# Patient Record
Sex: Male | Born: 2000
Health system: Southern US, Community
[De-identification: ages and names within clinical notes are randomized; demographics above are authoritative.]

## PROBLEM LIST (undated history)

## (undated) DIAGNOSIS — K900 Celiac disease: Secondary | ICD-10-CM

## (undated) DIAGNOSIS — R198 Other specified symptoms and signs involving the digestive system and abdomen: Secondary | ICD-10-CM

## (undated) DIAGNOSIS — T7840XA Allergy, unspecified, initial encounter: Secondary | ICD-10-CM

## (undated) DIAGNOSIS — K219 Gastro-esophageal reflux disease without esophagitis: Secondary | ICD-10-CM

## (undated) DIAGNOSIS — J189 Pneumonia, unspecified organism: Secondary | ICD-10-CM

## (undated) HISTORY — PX: ESOPHAGOGASTRODUODENOSCOPY ENDOSCOPY: SHX5814

## (undated) HISTORY — PX: SKIN GRAFT: SHX250

## (undated) HISTORY — DX: Celiac disease: K90.0

---
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
## 2022-01-28 ENCOUNTER — Encounter: Payer: Self-pay | Admitting: *Deleted

## 2022-02-04 DIAGNOSIS — F411 Generalized anxiety disorder: Secondary | ICD-10-CM | POA: Diagnosis not present

## 2022-02-04 DIAGNOSIS — F900 Attention-deficit hyperactivity disorder, predominantly inattentive type: Secondary | ICD-10-CM | POA: Diagnosis not present

## 2022-02-04 DIAGNOSIS — Z79899 Other long term (current) drug therapy: Secondary | ICD-10-CM | POA: Diagnosis not present

## 2022-02-26 ENCOUNTER — Encounter (HOSPITAL_COMMUNITY): Payer: Self-pay | Admitting: Emergency Medicine

## 2022-02-26 ENCOUNTER — Emergency Department (HOSPITAL_COMMUNITY)
Admission: EM | Admit: 2022-02-26 | Discharge: 2022-02-26 | Discharge status: Against Medical Advice | Payer: BC Managed Care – PPO | Source: Home / Self Care

## 2022-02-26 ENCOUNTER — Emergency Department (HOSPITAL_BASED_OUTPATIENT_CLINIC_OR_DEPARTMENT_OTHER): Payer: BC Managed Care – PPO

## 2022-02-26 ENCOUNTER — Encounter (HOSPITAL_BASED_OUTPATIENT_CLINIC_OR_DEPARTMENT_OTHER): Payer: Self-pay | Admitting: Urology

## 2022-02-26 ENCOUNTER — Inpatient Hospital Stay (HOSPITAL_BASED_OUTPATIENT_CLINIC_OR_DEPARTMENT_OTHER)
Admission: EM | Admit: 2022-02-26 | Discharge: 2022-02-28 | DRG: 200 | Disposition: A | Discharge status: Home / Self Care | Payer: BC Managed Care – PPO | Attending: Pulmonary Disease | Admitting: Pulmonary Disease

## 2022-02-26 ENCOUNTER — Other Ambulatory Visit: Payer: Self-pay

## 2022-02-26 DIAGNOSIS — T797XXA Traumatic subcutaneous emphysema, initial encounter: Principal | ICD-10-CM | POA: Diagnosis present

## 2022-02-26 DIAGNOSIS — Z809 Family history of malignant neoplasm, unspecified: Secondary | ICD-10-CM

## 2022-02-26 DIAGNOSIS — Z8249 Family history of ischemic heart disease and other diseases of the circulatory system: Secondary | ICD-10-CM | POA: Diagnosis not present

## 2022-02-26 DIAGNOSIS — R1110 Vomiting, unspecified: Secondary | ICD-10-CM | POA: Insufficient documentation

## 2022-02-26 DIAGNOSIS — Z5321 Procedure and treatment not carried out due to patient leaving prior to being seen by health care provider: Secondary | ICD-10-CM | POA: Insufficient documentation

## 2022-02-26 DIAGNOSIS — K219 Gastro-esophageal reflux disease without esophagitis: Secondary | ICD-10-CM | POA: Diagnosis present

## 2022-02-26 DIAGNOSIS — Z91119 Patient's noncompliance with dietary regimen due to unspecified reason: Secondary | ICD-10-CM | POA: Diagnosis not present

## 2022-02-26 DIAGNOSIS — T675XXA Heat exhaustion, unspecified, initial encounter: Secondary | ICD-10-CM | POA: Diagnosis present

## 2022-02-26 DIAGNOSIS — M791 Myalgia, unspecified site: Secondary | ICD-10-CM | POA: Insufficient documentation

## 2022-02-26 DIAGNOSIS — N179 Acute kidney failure, unspecified: Secondary | ICD-10-CM | POA: Diagnosis not present

## 2022-02-26 DIAGNOSIS — F4320 Adjustment disorder, unspecified: Secondary | ICD-10-CM | POA: Diagnosis present

## 2022-02-26 DIAGNOSIS — J939 Pneumothorax, unspecified: Secondary | ICD-10-CM | POA: Diagnosis not present

## 2022-02-26 DIAGNOSIS — Z20822 Contact with and (suspected) exposure to covid-19: Secondary | ICD-10-CM | POA: Diagnosis not present

## 2022-02-26 DIAGNOSIS — R112 Nausea with vomiting, unspecified: Secondary | ICD-10-CM

## 2022-02-26 DIAGNOSIS — R109 Unspecified abdominal pain: Secondary | ICD-10-CM | POA: Diagnosis not present

## 2022-02-26 DIAGNOSIS — J439 Emphysema, unspecified: Secondary | ICD-10-CM | POA: Diagnosis not present

## 2022-02-26 DIAGNOSIS — E872 Acidosis, unspecified: Secondary | ICD-10-CM | POA: Diagnosis not present

## 2022-02-26 DIAGNOSIS — A419 Sepsis, unspecified organism: Secondary | ICD-10-CM

## 2022-02-26 DIAGNOSIS — J982 Interstitial emphysema: Secondary | ICD-10-CM

## 2022-02-26 DIAGNOSIS — R221 Localized swelling, mass and lump, neck: Secondary | ICD-10-CM | POA: Diagnosis not present

## 2022-02-26 DIAGNOSIS — J9811 Atelectasis: Secondary | ICD-10-CM | POA: Diagnosis not present

## 2022-02-26 DIAGNOSIS — X30XXXA Exposure to excessive natural heat, initial encounter: Secondary | ICD-10-CM

## 2022-02-26 DIAGNOSIS — Z818 Family history of other mental and behavioral disorders: Secondary | ICD-10-CM | POA: Diagnosis not present

## 2022-02-26 DIAGNOSIS — E861 Hypovolemia: Secondary | ICD-10-CM | POA: Diagnosis not present

## 2022-02-26 DIAGNOSIS — K900 Celiac disease: Secondary | ICD-10-CM | POA: Diagnosis present

## 2022-02-26 DIAGNOSIS — E860 Dehydration: Secondary | ICD-10-CM | POA: Diagnosis not present

## 2022-02-26 DIAGNOSIS — M6282 Rhabdomyolysis: Secondary | ICD-10-CM | POA: Diagnosis present

## 2022-02-26 DIAGNOSIS — R17 Unspecified jaundice: Secondary | ICD-10-CM

## 2022-02-26 LAB — COMPREHENSIVE METABOLIC PANEL
ALT: 32 U/L (ref 0–44)
AST: 46 U/L — ABNORMAL HIGH (ref 15–41)
Albumin: 6 g/dL — ABNORMAL HIGH (ref 3.5–5.0)
Alkaline Phosphatase: 142 U/L — ABNORMAL HIGH (ref 38–126)
Anion gap: 26 — ABNORMAL HIGH (ref 5–15)
BUN: 26 mg/dL — ABNORMAL HIGH (ref 6–20)
CO2: 15 mmol/L — ABNORMAL LOW (ref 22–32)
Calcium: 11.7 mg/dL — ABNORMAL HIGH (ref 8.9–10.3)
Chloride: 98 mmol/L (ref 98–111)
Creatinine, Ser: 2.63 mg/dL — ABNORMAL HIGH (ref 0.61–1.24)
GFR, Estimated: 35 mL/min — ABNORMAL LOW (ref >60)
Glucose, Bld: 139 mg/dL — ABNORMAL HIGH (ref 70–99)
Potassium: 4.5 mmol/L (ref 3.5–5.1)
Sodium: 139 mmol/L (ref 135–145)
Total Bilirubin: 2.7 mg/dL — ABNORMAL HIGH (ref 0.3–1.2)
Total Protein: 9.6 g/dL — ABNORMAL HIGH (ref 6.5–8.1)

## 2022-02-26 LAB — URINALYSIS, ROUTINE W REFLEX MICROSCOPIC
Bilirubin Urine: NEGATIVE
Glucose, UA: NEGATIVE mg/dL
Ketones, ur: 5 mg/dL — AB
Leukocytes,Ua: NEGATIVE
Nitrite: NEGATIVE
Protein, ur: 100 mg/dL — AB
Specific Gravity, Urine: 1.023 (ref 1.005–1.030)
pH: 5 (ref 5.0–8.0)

## 2022-02-26 LAB — CBC WITH DIFFERENTIAL/PLATELET
Abs Immature Granulocytes: 0.13 10*3/uL — ABNORMAL HIGH (ref 0.00–0.07)
Basophils Absolute: 0 10*3/uL (ref 0.0–0.1)
Basophils Relative: 0 %
Eosinophils Absolute: 0 10*3/uL (ref 0.0–0.5)
Eosinophils Relative: 0 %
HCT: 51.9 % (ref 39.0–52.0)
Hemoglobin: 18 g/dL — ABNORMAL HIGH (ref 13.0–17.0)
Immature Granulocytes: 1 %
Lymphocytes Relative: 7 %
Lymphs Abs: 1.6 10*3/uL (ref 0.7–4.0)
MCH: 30.7 pg (ref 26.0–34.0)
MCHC: 34.7 g/dL (ref 30.0–36.0)
MCV: 88.6 fL (ref 80.0–100.0)
Monocytes Absolute: 1.1 10*3/uL — ABNORMAL HIGH (ref 0.1–1.0)
Monocytes Relative: 5 %
Neutro Abs: 20.1 10*3/uL — ABNORMAL HIGH (ref 1.7–7.7)
Neutrophils Relative %: 87 %
Platelets: 487 10*3/uL — ABNORMAL HIGH (ref 150–400)
RBC: 5.86 MIL/uL — ABNORMAL HIGH (ref 4.22–5.81)
RDW: 12 % (ref 11.5–15.5)
WBC: 22.9 10*3/uL — ABNORMAL HIGH (ref 4.0–10.5)
nRBC: 0 % (ref 0.0–0.2)

## 2022-02-26 LAB — LIPASE, BLOOD: Lipase: 24 U/L (ref 11–51)

## 2022-02-26 LAB — CK: Total CK: 685 U/L — ABNORMAL HIGH (ref 49–397)

## 2022-02-26 MED ORDER — ONDANSETRON HCL 4 MG/2ML IJ SOLN
4.0000 mg | Freq: Once | INTRAMUSCULAR | Status: AC
  Administered 2022-02-26: 4 mg via INTRAVENOUS
  Filled 2022-02-26: qty 2

## 2022-02-26 MED ORDER — SODIUM CHLORIDE 0.9 % IV BOLUS
2000.0000 mL | Freq: Once | INTRAVENOUS | Status: AC
  Administered 2022-02-26: 1000 mL via INTRAVENOUS

## 2022-02-26 MED ORDER — SODIUM CHLORIDE 0.9 % IV BOLUS
1000.0000 mL | INTRAVENOUS | Status: DC
  Administered 2022-02-26: 1000 mL via INTRAVENOUS

## 2022-02-26 MED ORDER — SODIUM CHLORIDE 0.9 % IV BOLUS: 2000.0000 mL | Freq: Once | INTRAVENOUS | Status: DC

## 2022-02-26 MED ORDER — DOXYCYCLINE HYCLATE 100 MG PO TABS: 200.0000 mg | ORAL_TABLET | Freq: Once | ORAL | Status: DC

## 2022-02-26 NOTE — ED Triage Notes (Signed)
Pt reports he was outside working in the heat all day, drinking little water.  He has been throwing up and experiencing muscle cramping.

## 2022-02-26 NOTE — ED Notes (Signed)
Per Provider - collect lactic acid prior to additional fluid boluses -- give 1L bolus now then administer PO doxycycline -- give 2nd additional L bolus after ABX

## 2022-02-26 NOTE — ED Notes (Signed)
Patient left without being seen, states we are going to [facility] care.

## 2022-02-26 NOTE — ED Notes (Signed)
ED Provider at bedside. 

## 2022-02-26 NOTE — ED Notes (Signed)
Called [facility] for Consult to [place] with Ruby@11 :36 Reason for Consult - Acute Renal Failure and dehydration

## 2022-02-26 NOTE — ED Provider Triage Note (Signed)
Emergency Medicine Provider Triage Evaluation Note  [name] [name] , a 21 y.o. male  was evaluated in triage.  Pt complains of body cramping, vomiting.  Symptoms began today and have gradually worsened.  States that he did not feel that he was adequately hydrated this weekend and then was working outside today at a golf [place].  Reports abdominal cramping as well.  No dysuria.  Review of Systems  Positive: Body aches, cramping, vomiting Negative: Dysuria, chest pain  Physical Exam  BP 136/67   Pulse (!) 108   Temp 97.8 F (36.6 C) (Oral)   Resp 18   Ht 6' 3"  (1.905 m)   Wt (!) 225.9 kg   SpO2 94%   BMI 62.25 kg/m  Gen:   Awake, no distress   Resp:  Normal effort  MSK:   Moves extremities without difficulty  Other:  Abdomen generally tender without rebound or guarding  Medical Decision Making  Medically screening exam initiated at 8:49 PM.  Appropriate orders placed.  Detron Lofton was informed that the remainder of the evaluation will be completed by another provider, this initial triage assessment does not replace that evaluation, and the importance of remaining in the ED until their evaluation is complete.  Labs and urinalysis ordered   Delia Heady, PA-C 02/26/22 2050

## 2022-02-26 NOTE — ED Triage Notes (Signed)
Pt reports vomiting since this afternoon after being in the heat  Reports full body cramping

## 2022-02-27 ENCOUNTER — Emergency Department (HOSPITAL_BASED_OUTPATIENT_CLINIC_OR_DEPARTMENT_OTHER): Payer: BC Managed Care – PPO

## 2022-02-27 ENCOUNTER — Encounter (HOSPITAL_BASED_OUTPATIENT_CLINIC_OR_DEPARTMENT_OTHER): Payer: Self-pay | Admitting: Emergency Medicine

## 2022-02-27 ENCOUNTER — Inpatient Hospital Stay (HOSPITAL_COMMUNITY): Payer: BC Managed Care – PPO

## 2022-02-27 DIAGNOSIS — M6282 Rhabdomyolysis: Secondary | ICD-10-CM | POA: Diagnosis not present

## 2022-02-27 DIAGNOSIS — J9811 Atelectasis: Secondary | ICD-10-CM | POA: Diagnosis not present

## 2022-02-27 DIAGNOSIS — J982 Interstitial emphysema: Secondary | ICD-10-CM | POA: Diagnosis not present

## 2022-02-27 DIAGNOSIS — T675XXA Heat exhaustion, unspecified, initial encounter: Secondary | ICD-10-CM | POA: Diagnosis not present

## 2022-02-27 DIAGNOSIS — Z91119 Patient's noncompliance with dietary regimen due to unspecified reason: Secondary | ICD-10-CM | POA: Diagnosis not present

## 2022-02-27 DIAGNOSIS — Z8249 Family history of ischemic heart disease and other diseases of the circulatory system: Secondary | ICD-10-CM | POA: Diagnosis not present

## 2022-02-27 DIAGNOSIS — K219 Gastro-esophageal reflux disease without esophagitis: Secondary | ICD-10-CM | POA: Diagnosis not present

## 2022-02-27 DIAGNOSIS — E861 Hypovolemia: Secondary | ICD-10-CM | POA: Diagnosis not present

## 2022-02-27 DIAGNOSIS — K900 Celiac disease: Secondary | ICD-10-CM | POA: Diagnosis not present

## 2022-02-27 DIAGNOSIS — Z809 Family history of malignant neoplasm, unspecified: Secondary | ICD-10-CM | POA: Diagnosis not present

## 2022-02-27 DIAGNOSIS — J439 Emphysema, unspecified: Secondary | ICD-10-CM | POA: Diagnosis not present

## 2022-02-27 DIAGNOSIS — J939 Pneumothorax, unspecified: Secondary | ICD-10-CM | POA: Diagnosis not present

## 2022-02-27 DIAGNOSIS — T797XXA Traumatic subcutaneous emphysema, initial encounter: Secondary | ICD-10-CM | POA: Diagnosis not present

## 2022-02-27 DIAGNOSIS — X30XXXA Exposure to excessive natural heat, initial encounter: Secondary | ICD-10-CM | POA: Diagnosis not present

## 2022-02-27 DIAGNOSIS — E860 Dehydration: Secondary | ICD-10-CM | POA: Diagnosis not present

## 2022-02-27 DIAGNOSIS — R221 Localized swelling, mass and lump, neck: Secondary | ICD-10-CM | POA: Diagnosis not present

## 2022-02-27 DIAGNOSIS — E872 Acidosis, unspecified: Secondary | ICD-10-CM | POA: Diagnosis not present

## 2022-02-27 DIAGNOSIS — F4320 Adjustment disorder, unspecified: Secondary | ICD-10-CM | POA: Diagnosis not present

## 2022-02-27 DIAGNOSIS — Z818 Family history of other mental and behavioral disorders: Secondary | ICD-10-CM | POA: Diagnosis not present

## 2022-02-27 DIAGNOSIS — Z20822 Contact with and (suspected) exposure to covid-19: Secondary | ICD-10-CM | POA: Diagnosis not present

## 2022-02-27 DIAGNOSIS — N179 Acute kidney failure, unspecified: Secondary | ICD-10-CM | POA: Diagnosis not present

## 2022-02-27 LAB — PHOSPHORUS: Phosphorus: 5.3 mg/dL — ABNORMAL HIGH (ref 2.5–4.6)

## 2022-02-27 LAB — CBC
HCT: 40.9 % (ref 39.0–52.0)
Hemoglobin: 14.3 g/dL (ref 13.0–17.0)
MCH: 31.4 pg (ref 26.0–34.0)
MCHC: 35 g/dL (ref 30.0–36.0)
MCV: 89.7 fL (ref 80.0–100.0)
Platelets: 370 10*3/uL (ref 150–400)
RBC: 4.56 MIL/uL (ref 4.22–5.81)
RDW: 12.3 % (ref 11.5–15.5)
WBC: 19.7 10*3/uL — ABNORMAL HIGH (ref 4.0–10.5)
nRBC: 0 % (ref 0.0–0.2)

## 2022-02-27 LAB — GLUCOSE, CAPILLARY
Glucose-Capillary: 100 mg/dL — ABNORMAL HIGH (ref 70–99)
Glucose-Capillary: 109 mg/dL — ABNORMAL HIGH (ref 70–99)
Glucose-Capillary: 84 mg/dL (ref 70–99)
Glucose-Capillary: 94 mg/dL (ref 70–99)
Glucose-Capillary: 95 mg/dL (ref 70–99)

## 2022-02-27 LAB — LACTIC ACID, PLASMA: Lactic Acid, Venous: 1.7 mmol/L (ref 0.5–1.9)

## 2022-02-27 LAB — COMPREHENSIVE METABOLIC PANEL
ALT: 24 U/L (ref 0–44)
AST: 48 U/L — ABNORMAL HIGH (ref 15–41)
Albumin: 4.3 g/dL (ref 3.5–5.0)
Alkaline Phosphatase: 94 U/L (ref 38–126)
Anion gap: 11 (ref 5–15)
BUN: 20 mg/dL (ref 6–20)
CO2: 21 mmol/L — ABNORMAL LOW (ref 22–32)
Calcium: 8.9 mg/dL (ref 8.9–10.3)
Chloride: 106 mmol/L (ref 98–111)
Creatinine, Ser: 1.35 mg/dL — ABNORMAL HIGH (ref 0.61–1.24)
GFR, Estimated: >60 mL/min (ref >60)
Glucose, Bld: 116 mg/dL — ABNORMAL HIGH (ref 70–99)
Potassium: 4.8 mmol/L (ref 3.5–5.1)
Sodium: 138 mmol/L (ref 135–145)
Total Bilirubin: 1.7 mg/dL — ABNORMAL HIGH (ref 0.3–1.2)
Total Protein: 7 g/dL (ref 6.5–8.1)

## 2022-02-27 LAB — HIV ANTIBODY (ROUTINE TESTING W REFLEX): HIV Screen 4th Generation wRfx: NONREACTIVE

## 2022-02-27 LAB — SARS CORONAVIRUS 2 BY RT PCR: SARS Coronavirus 2 by RT PCR: NEGATIVE

## 2022-02-27 LAB — CK: Total CK: 1253 U/L — ABNORMAL HIGH (ref 49–397)

## 2022-02-27 LAB — MRSA NEXT GEN BY PCR, NASAL: MRSA by PCR Next Gen: NOT DETECTED

## 2022-02-27 LAB — MAGNESIUM: Magnesium: 2.1 mg/dL (ref 1.7–2.4)

## 2022-02-27 MED ORDER — VANCOMYCIN HCL IN DEXTROSE 1-5 GM/200ML-% IV SOLN: 1000.0000 mg | Freq: Once | INTRAVENOUS | Status: DC

## 2022-02-27 MED ORDER — IOHEXOL 300 MG/ML  SOLN
200.0000 mL | Freq: Once | INTRAMUSCULAR | Status: AC | PRN
  Administered 2022-02-27: 200 mL via ORAL

## 2022-02-27 MED ORDER — ENOXAPARIN SODIUM 40 MG/0.4ML IJ SOSY
40.0000 mg | PREFILLED_SYRINGE | INTRAMUSCULAR | Status: DC
Start: 2022-02-27 — End: 2022-02-28
  Administered 2022-02-27 – 2022-02-28 (×2): 40 mg via SUBCUTANEOUS
  Filled 2022-02-27 (×2): qty 0.4

## 2022-02-27 MED ORDER — SODIUM CHLORIDE 0.9 % IV SOLN
2.0000 g | Freq: Once | INTRAVENOUS | Status: AC
  Administered 2022-02-27: 2 g via INTRAVENOUS
  Filled 2022-02-27: qty 12.5

## 2022-02-27 MED ORDER — ONDANSETRON HCL 4 MG/2ML IJ SOLN
4.0000 mg | Freq: Three times a day (TID) | INTRAMUSCULAR | Status: DC | PRN
Start: 2022-02-27 — End: 2022-02-28

## 2022-02-27 MED ORDER — PIPERACILLIN-TAZOBACTAM 3.375 G IVPB
3.3750 g | Freq: Three times a day (TID) | INTRAVENOUS | Status: DC
  Administered 2022-02-27 – 2022-02-28 (×4): 3.375 g via INTRAVENOUS
  Filled 2022-02-27 (×5): qty 50

## 2022-02-27 MED ORDER — METRONIDAZOLE 500 MG/100ML IV SOLN
500.0000 mg | Freq: Once | INTRAVENOUS | Status: AC
  Administered 2022-02-27: 500 mg via INTRAVENOUS
  Filled 2022-02-27: qty 100

## 2022-02-27 MED ORDER — SODIUM CHLORIDE 0.9 % IV BOLUS (SEPSIS)
1000.0000 mL | Freq: Once | INTRAVENOUS | Status: AC
  Administered 2022-02-27: 1000 mL via INTRAVENOUS

## 2022-02-27 MED ORDER — PANTOPRAZOLE SODIUM 40 MG IV SOLR
40.0000 mg | INTRAVENOUS | Status: DC
Start: 2022-02-27 — End: 2022-02-28
  Administered 2022-02-27: 40 mg via INTRAVENOUS
  Filled 2022-02-27: qty 10

## 2022-02-27 MED ORDER — DOCUSATE SODIUM 100 MG PO CAPS: 100.0000 mg | ORAL_CAPSULE | Freq: Two times a day (BID) | ORAL | Status: DC | PRN

## 2022-02-27 MED ORDER — POLYETHYLENE GLYCOL 3350 17 G PO PACK: 17.0000 g | PACK | Freq: Every day | ORAL | Status: DC | PRN

## 2022-02-27 MED ORDER — CHLORHEXIDINE GLUCONATE CLOTH 2 % EX PADS
6.0000 | MEDICATED_PAD | Freq: Every day | CUTANEOUS | Status: DC
  Administered 2022-02-27 – 2022-02-28 (×2): 6 via TOPICAL

## 2022-02-27 MED ORDER — ORAL CARE MOUTH RINSE
15.0000 mL | OROMUCOSAL | Status: DC | PRN
Start: 2022-02-27 — End: 2022-02-28

## 2022-02-27 MED ORDER — LACTATED RINGERS IV SOLN: INTRAVENOUS | Status: AC

## 2022-02-27 MED ORDER — SODIUM CHLORIDE 0.9 % IV BOLUS (SEPSIS)
1000.0000 mL | Freq: Once | INTRAVENOUS | Status: AC
Start: 2022-02-27 — End: 2022-02-27
  Administered 2022-02-27: 1000 mL via INTRAVENOUS

## 2022-02-27 MED ORDER — VANCOMYCIN HCL IN DEXTROSE 1-5 GM/200ML-% IV SOLN
1000.0000 mg | INTRAVENOUS | Status: AC
  Administered 2022-02-27: 1000 mg via INTRAVENOUS
  Filled 2022-02-27: qty 200

## 2022-02-27 NOTE — Progress Notes (Signed)
Pharmacy Antibiotic Note  Edric Fetterman is a 21 y.o. male admitted on 02/26/2022 with  perforated esophagus .  Pharmacy has been consulted for Zosyn dosing.  Plan: Rec'd vanc, cefepime, and metronidazole in ED. Zosyn 3.375g IV q8h (4 hour infusion).  Height: 6' 3"  (190.5 cm) Weight: 89.8 kg (198 lb) IBW/kg (Calculated) : 84.5  Temp (24hrs), Avg:98 F (36.7 C), Min:97.8 F (36.6 C), Max:98.1 F (36.7 C)  Recent Labs  Lab 02/26/22 2054 02/26/22 2358  WBC 22.9*  --   CREATININE 2.63*  --   LATICACIDVEN  --  1.7    Estimated Creatinine Clearance: 54.4 mL/min (A) (by C-G formula based on SCr of 2.63 mg/dL (H)).    Allergies  Allergen Reactions   Gluten Meal     Thank you for allowing pharmacy to be a part of this patient's care.  Wynona Neat, PharmD, BCPS  02/27/2022 5:00 AM

## 2022-02-27 NOTE — Plan of Care (Signed)
General Care plan added

## 2022-02-27 NOTE — Progress Notes (Signed)
21 year old male with celiac disease was admitted with diffuse pneumomediastinum after he started with nausea and vomiting  Patient was on nonrebreather mask with a stable vital signs Nonrebreather was switched to nasal cannula oxygen  GI was consulted, they recommend doing upper GI series, it was done, negative for perforation or esophageal leak  Patient is on IV fluid with improvement in serum creatinine, he had AKI due to severe dehydration and probable rhabdomyolysis with elevated CK level   We will start him on clear liquid diet . Continue close monitoring   Total critical care time: 32 minutes  Performed by: [place] care time was exclusive of separately billable procedures and treating other patients.   Critical care was necessary to treat or prevent imminent or life-threatening deterioration.   Critical care was time spent personally by me on the following activities: development of treatment plan with patient and/or surrogate as well as nursing, discussions with consultants, evaluation of patient's response to treatment, examination of patient, obtaining history from patient or surrogate, ordering and performing treatments and interventions, ordering and review of laboratory studies, ordering and review of radiographic studies, pulse oximetry and re-evaluation of patient's condition.   Jacky Kindle, MD [facility] See Amion for pager If no response to pager, please call [phone] until 7pm After 7pm, Please call [facility] [phone]

## 2022-02-27 NOTE — ED Notes (Addendum)
Pt placed on 4L O2 via [place] for pneumothorax mgmt -- verbal order from Dr. Randal Buba to increase to 6L O2 via [place] -- O2 sats 98% on RA; improved to 100% on 4L O2 via [place];  RT now titrating to 6L humidified O2 via [place]

## 2022-02-27 NOTE — TOC Progression Note (Signed)
Transition of Care [facility]) - Progression Note    Patient Details  Name: [name] [name] MRN: [id number] Date of Birth: 12/31/2000  Transition of [facility]) CM/SW [place], RN Phone Number:[phone]  02/27/2022, 4:08 PM  Clinical Narrative:    TOC following patient admitted with diffuse pneumomediastinum. Currently there are no TOC needs identified.  We will continue to monitor patient advancement through interdisciplinary progression rounds.        Expected Discharge Plan and Services                                                 Social Determinants of Health (SDOH) Interventions    Readmission Risk Interventions     No data to display

## 2022-02-27 NOTE — ED Provider Notes (Signed)
21 yo M received in transfer from [place].  Intractable n/v since this afternoon.  Found to have pnemoperitoneum.  Given broad spectrum abx, transferred here for ICU admission.  Arrives feeling well, denies nausea, vitals stable.  Crit care at bedside.   The patients results and plan were reviewed and discussed.   Any x-rays performed were independently reviewed by myself.   Differential diagnosis were considered with the presenting HPI.  Medications  lactated ringers infusion ( Intravenous New Bag/Given 02/27/22 0230)  sodium chloride 0.9 % bolus 1,000 mL (0 mLs Intravenous Stopped 02/27/22 0206)    And  sodium chloride 0.9 % bolus 1,000 mL (0 mLs Intravenous Stopped 02/27/22 0229)    And  sodium chloride 0.9 % bolus 1,000 mL (1,000 mLs Intravenous New Bag/Given 02/27/22 0348)  vancomycin (VANCOCIN) IVPB 1000 mg/200 mL premix (0 mg Intravenous Stopped 02/27/22 0343)  ondansetron (ZOFRAN) injection 4 mg (4 mg Intravenous Given 02/26/22 2243)  sodium chloride 0.9 % bolus 2,000 mL (0 mLs Intravenous Stopped 02/26/22 2321)  ceFEPIme (MAXIPIME) 2 g in sodium chloride 0.9 % 100 mL IVPB (0 g Intravenous Stopped 02/27/22 0120)  metroNIDAZOLE (FLAGYL) IVPB 500 mg (0 mg Intravenous Stopped 02/27/22 0225)    Vitals:   02/27/22 0213 02/27/22 0214 02/27/22 0300 02/27/22 0345  BP:   129/65 130/70  Pulse:   61 69  Resp:   (!) 22 17  Temp:    98.1 F (36.7 C)  TempSrc:    Oral  SpO2: 100% 100% 100% 100%  Weight:      Height:        Final diagnoses:  Heat exhaustion, initial encounter  Dehydration  Nausea and vomiting, unspecified vomiting type  AKI (acute kidney injury) ([place])  Non-traumatic rhabdomyolysis  Sepsis, due to unspecified organism, unspecified whether acute organ dysfunction present ([place])  Elevated bilirubin  Pneumothorax, unspecified type  Pneumomediastinum ([place])  Subcutaneous air, initial encounter [facility])    Admission/ observation were discussed with the admitting physician, patient  and/or family and they are comfortable with the plan.     Deno Etienne, DO 02/27/22 [phone]

## 2022-02-27 NOTE — Sepsis Progress Note (Signed)
Following for sepsis monitoring ?

## 2022-02-27 NOTE — ED Notes (Signed)
Pt bib [facility] from [facility] to be admitted. Pt respirations even and unlabored, reporting 2/10 throat discomfort upon exhalation. NAD noted at this time

## 2022-02-27 NOTE — H&P (Signed)
NAMEAna [name], MRN:  [id number], DOB:  [birth year], LOS: 0 ADMISSION DATE:  02/26/2022, CONSULTATION DATE:  7/6 REFERRING MD:  Dr Randal Buba , CHIEF COMPLAINT:  Pneumomediastinum   History of Present Illness:  21 year old male with past medical history as below, which is significant for celiac disease and GERD.  He presented to [facility] 7/5 with complaints of intractable nausea and vomiting for several hours.  Notably he was at the beach the weekend prior to presentation and spent a lot of time in the heat without adequate hydration.  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  Around 3 PM he developed nausea and vomiting.  He was unable to keep down any fluids or solids.  He also experience bad abdominal cramping as well as muscle cramping in the upper lower extremities. He had approximately 20 episodes of vomiting in a 4 hour period refractory to Zofran, for which reason he presented to ED. Laboratory evaluation significant for multiple metabolic derrangements. CXR done in the ED demonstrated subcutaneous emphysema, which prompted CT. CT showed extensive pneumomediastinum and subcutaneous emphysema without definite evidence of esophageal perforation. PCCM was asked to admit for close monitoring.   Pertinent  Medical History   has a past medical history of Allergy, Celiac disease, GERD (gastroesophageal reflux disease), GI symptoms, and Pneumonia.   Significant Hospital Events: Including procedures, antibiotic start and stop dates in addition to other pertinent events   7/5 admit for pneumomediastinum  Interim History / Subjective:    Objective   Blood pressure 130/70, pulse 69, temperature 98.1 F (36.7 C), temperature source Oral, resp. rate 17, height 6' 3"  (1.905 m), weight 89.8 kg, SpO2 100 %.        Intake/Output Summary (Last 24 hours) at 02/27/2022 0419 Last data filed at 02/27/2022 0343 Gross per 24 hour  Intake 200 ml  Output --   Net 200 ml   Filed Weights   02/26/22 2224  Weight: 89.8 kg    Examination: General: young adulT male in NAD HENT: [place]/AT, PERRL, no JVD. Crepitus tracking into neck.  Lungs: Clear bilateral breath sounds. No distress Cardiovascular: RRR, no MRG Abdomen: Soft, non-tender, non-distended Extremities: No acute deformity or ROM limitation Neuro: Alert, oriented, non-focal  Imaging reviewed by me:   7/5 CXR: Subcutaneous emphysema and pneumomediastinum similar to that seen on prior CT of the abdomen  CT renal: Moderate severity pneumomediastinum with a small adjacent anteromedial right-sided pneumothorax  CT soft tissue neck: Large amount of gas tracking through the deep soft tissues of the neck, the visualized mediastinum and the axillae.  CT chest: Extensive pneumomediastinum and subcutaneous emphysema. No definite evidence of esophageal perforation.  Resolved Hospital Problem list     Assessment & Plan:   Pneumomediastinum secondary to intractable nausea and vomiting presumably in the setting of heat exhaustion.  - Admit to ICU for close monitoring  - 100% FiO2 - ED spoke to CVTS, GI to consult in AM - Bedrest - NPO - Zosyn per pharmacy with concern for esophogeal perforation  - LR 17m/hr s/p 5L IVF resuscitation  AKI: secondary to dehydration + rhabdo - IVF as above - trend K - Trend chemistry - Check mag  Anion gap elevated: 26. Not well understood. Lactic 1.7, BUN 26. Trace ketones in urine. Denies any illicit substance use.   Tick exposure: but not bite reported - if symptoms don't improve perhaps worth further workup  Best Practice (right click and "Reselect all SmartList Selections" daily)   Diet/type: NPO DVT prophylaxis: LMWH GI prophylaxis: N/A Lines: N/A Foley:  N/A Code Status:  full code Last date of multidisciplinary goals of care discussion [ ]   Labs   CBC: Recent Labs  Lab 02/26/22 2054  WBC 22.9*  NEUTROABS 20.1*  HGB 18.0*  HCT  51.9  MCV 88.6  PLT 487*    Basic Metabolic Panel: Recent Labs  Lab 02/26/22 2054  NA 139  K 4.5  CL 98  CO2 15*  GLUCOSE 139*  BUN 26*  CREATININE 2.63*  CALCIUM 11.7*   GFR: Estimated Creatinine Clearance: 54.4 mL/min (A) (by C-G formula based on SCr of 2.63 mg/dL (H)). Recent Labs  Lab 02/26/22 2054 02/26/22 2358  WBC 22.9*  --   LATICACIDVEN  --  1.7    Liver Function Tests: Recent Labs  Lab 02/26/22 2054  AST 46*  ALT 32  ALKPHOS 142*  BILITOT 2.7*  PROT 9.6*  ALBUMIN 6.0*   Recent Labs  Lab 02/26/22 2054  LIPASE 24   No results for input(s): "AMMONIA" in the last 168 hours.  ABG No results found for: "PHART", "PCO2ART", "PO2ART", "HCO3", "TCO2", "ACIDBASEDEF", "O2SAT"   Coagulation Profile: No results for input(s): "INR", "PROTIME" in the last 168 hours.  Cardiac Enzymes: Recent Labs  Lab 02/26/22 2054  CKTOTAL 685*    HbA1C: No results found for: "HGBA1C"  CBG: No results for input(s): "GLUCAP" in the last 168 hours.  Review of Systems:   Feeling much better.  Bolds are positive  Constitutional: weight loss, gain, night sweats, Fevers, chills, fatigue .  HEENT: headaches, Sore throat, sneezing, nasal congestion, post nasal drip, Difficulty swallowing, Tooth/dental problems, visual complaints visual changes, ear ache CV:  chest pain, radiates:,Orthopnea, PND, swelling in lower extremities, dizziness, palpitations, syncope.  GI  heartburn, indigestion, abdominal pain, nausea, vomiting, diarrhea, change in bowel habits, loss of appetite, bloody stools.  Resp: cough, productive: , hemoptysis, dyspnea, chest pain, pleuritic.  Skin: rash or itching or icterus GU: dysuria, change in color of urine, urgency or frequency. flank pain, hematuria  MS: joint pain or swelling. decreased range of motion  Psych: change in mood or affect. depression or anxiety.  Neuro: difficulty with speech, weakness, numbness, ataxia     Past Medical History:   He,  has a past medical history of Allergy, Celiac disease, GERD (gastroesophageal reflux disease), GI symptoms, and Pneumonia.   Surgical History:   Past Surgical History:  Procedure Laterality Date   ENTEROSCOPY N/A 03/25/2016   Procedure: ENTEROSCOPY;  Surgeon: Ronald Lobo, MD;  Location: [facility] ENDOSCOPY;  Service: Endoscopy;  Laterality: N/A;   ESOPHAGOGASTRODUODENOSCOPY N/A 08/13/2016   Procedure: ESOPHAGOGASTRODUODENOSCOPY (EGD);  Surgeon: Ronald Lobo, MD;  Location[facility] ENDOSCOPY;  Service: Endoscopy;  Laterality: N/A;   ESOPHAGOGASTRODUODENOSCOPY ENDOSCOPY     SKIN GRAFT     right thigh to right finger     Social History:   reports that he has never smoked. He has never used smokeless tobacco. He reports that he does not drink alcohol and does not use drugs.   Family History:  His family history includes Bipolar disorder in his maternal grandfather; Cancer in his maternal grandmother; Coronary artery disease in his paternal grandfather; Depression in his father, maternal grandfather, and paternal aunt.   Allergies Allergies  Allergen Reactions   Gluten Meal      Home Medications  Prior to Admission medications   Medication Sig Start  Date End Date Taking? Authorizing Provider  hydrOXYzine (VISTARIL) 25 MG capsule Take 25 mg by mouth every 8 (eight) hours as needed for anxiety (may take an additional 89m at bedtime as needed.). may take an additional 566mat bedtime as needed. Patient not taking: Reported on 04/30/2021    [provider]  lisdexamfetamine (VYVANSE) 40 MG capsule 1 cap every morning with breakfast 08/16/18   NaErlinda HongNP  lisdexamfetamine (VYVANSE) 50 MG capsule Take 50 mg by mouth daily.    [provider]  propranolol (INDERAL) 10 MG tablet Take 10 mg by mouth daily as needed.    [provider]  sertraline (ZOLOFT) 25 MG tablet Take 50 mg by mouth daily.    [provider]     Critical care time:      PaGeorgann HousekeeperAGACNP-BC [facility]  See Amion for personal pager PCCM on call pager ([phone] 7pm. Please call El[facility] 7p-7a. [phone]/01/2022 4:50 AM

## 2022-02-27 NOTE — Consult Note (Signed)
Referring Provider: [facility] Primary Care Physician:  Lind Covert, MD Primary Gastroenterologist: [facility]  Reason for Consultation: pneumomediastinum  HPI: [name] [name] is a 21 y.o. male with past medical history of celiac disease, GERD who presented to the ED with nausea and vomiting.   Patient reports that over the weekend he went to the beach.  He reports drinking around 60 alcoholic drinks between liquor and beer over the weekend.  He notes he did not drink very much water.  Yesterday he attempted to go to work.  He works at an [employer].  He started to feel ill with the heat and began to vomit.  Notes emesis was nonbloody and nonbilious.  He continued vomiting 20+ times throughout the rest of the evening.  He attempted to eat small amounts of food and was not able to tolerate oral intake.  He also attempted to drink small amounts of fluid but was again unable to tolerate.  He presented to the ED yesterday as he could not keep any fluids down.  He began to have muscle aches and cramps.  He did note 1 episode of darker possible red emesis after drinking cheerwine.  Denies chest pain. Noted some epigastric abdominal pain today episodes. Denies melena, hematochezia, constipation, diarrhea.  Urine drug screen positive for THC. He reports some discomfort with breathing, this is improved when he sits propped up.  He reports no further episodes of emesis since receiving Zofran. He continues to eat gluten on occasion.  Denies gluten consumption over the weekend.  When he does eat gluten he will have diarrhea and abdominal pain.  EGD 07/2016  Normal EGD biopsies taken Duodenum biopsy benign small bowel mucosa showing normal to mildly increased numbers of intraepithelial lymphocytes, no evidence of villous blunting, active inflammation or granuloma formation.  No dysplasia.   EGD 04/13/2016 Visually normal EGD Biopsies showing enteritis consistent with celiac sprue Past Medical  History:  Diagnosis Date   Allergy    Celiac disease    GERD (gastroesophageal reflux disease)    GI symptoms    Pneumonia     Past Surgical History:  Procedure Laterality Date   ENTEROSCOPY N/A 03/25/2016   Procedure: ENTEROSCOPY;  Surgeon: Ronald Lobo, MD;  Location: [facility];  Service: Endoscopy;  Laterality: N/A;   ESOPHAGOGASTRODUODENOSCOPY N/A 08/13/2016   Procedure: ESOPHAGOGASTRODUODENOSCOPY (EGD);  Surgeon: Ronald Lobo, MD;  Location: [facility] ENDOSCOPY;  Service: Endoscopy;  Laterality: N/A;   ESOPHAGOGASTRODUODENOSCOPY ENDOSCOPY     SKIN GRAFT     right thigh to right finger    Prior to Admission medications   Medication Sig Start Date End Date Taking? Authorizing Provider  propranolol (INDERAL) 10 MG tablet Take 10 mg by mouth daily as needed (anxiety).   Yes [provider]  sertraline (ZOLOFT) 50 MG tablet Take 50 mg by mouth every morning. 12/10/21  Yes [provider]  lisdexamfetamine (VYVANSE) 30 MG capsule Take 30 mg by mouth daily. Patient not taking: Reported on 02/27/2022    [provider]  lisdexamfetamine (VYVANSE) 40 MG capsule 1 cap every morning with breakfast Patient not taking: Reported on 02/27/2022 08/16/18   Erlinda Hong, NP    Scheduled Meds:  Chlorhexidine Gluconate Cloth  6 each Topical Q0600   enoxaparin (LOVENOX) injection  40 mg Subcutaneous Q24H   Continuous Infusions:  lactated ringers 125 mL/hr at 02/27/22 0452   piperacillin-tazobactam (ZOSYN)  IV 3.375 g (02/27/22 0609)   PRN Meds:.docusate sodium, ondansetron (ZOFRAN) IV, mouth  rinse, polyethylene glycol  Allergies as of 02/26/2022 - Review Complete 02/26/2022  Allergen Reaction Noted   Gluten meal  10/14/2017    Family History  Problem Relation Age of Onset   Depression Father    Cancer Maternal Grandmother    Depression Paternal Aunt    Bipolar disorder Maternal Grandfather    Depression Maternal Grandfather    Coronary artery disease  Paternal Grandfather     Social History   Socioeconomic History   Marital status: Single    Spouse name: Not on file   Number of children: Not on file   Years of education: Not on file   Highest education level: Not on file  Occupational History   Not on file  Tobacco Use   Smoking status: Never   Smokeless tobacco: Never  Substance and Sexual Activity   Alcohol use: No   Drug use: No   Sexual activity: Never  Other Topics Concern   Not on file  Social History Narrative   Lives at home with parents and Lab ([place]) starting 12 th grade Fall 2022.  Interested in college (perhaps [employer]) for business.  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     Reports recreational use of substances    Social Determinants of Health   Financial Resource Strain: Not on file  Food Insecurity: Not on file  Transportation Needs: Not on file  Physical Activity: Not on file  Stress: Not on file  Social Connections: Not on file  Intimate Partner Violence: Not on file    Review of Systems: Review of Systems  Constitutional:  Positive for malaise/fatigue. Negative for chills and fever.  HENT:  Negative for hearing loss and tinnitus.   Eyes:  Negative for blurred vision and double vision.  Respiratory:  Negative for cough and hemoptysis.   Cardiovascular:  Negative for chest pain and palpitations.  Gastrointestinal:  Positive for nausea and vomiting. Negative for abdominal pain, blood in stool, constipation, diarrhea, heartburn and melena.  Genitourinary:  Negative for dysuria and urgency.  Musculoskeletal:  Negative for myalgias and neck pain.  Skin:  Negative for itching and rash.  Neurological:  Negative for dizziness and headaches.  Endo/Heme/Allergies:  Negative for environmental allergies. Does not bruise/bleed easily.  Psychiatric/Behavioral:  Positive for substance abuse. Negative for depression.      Physical Exam:Physical Exam Constitutional:      General: He is not in acute distress.     Appearance: He is normal weight.  HENT:     Head: Normocephalic and atraumatic.     Right Ear: External ear normal.     Left Ear: External ear normal.     Nose: Nose normal.     Mouth/Throat:     Mouth: Mucous membranes are moist.  Eyes:     Pupils: Pupils are equal, round, and reactive to light.  Cardiovascular:     Rate and Rhythm: Normal rate and regular rhythm.     Pulses: Normal pulses.     Heart sounds: Normal heart sounds.  Pulmonary:     Effort: Pulmonary effort is normal.     Breath sounds: Normal breath sounds.  Abdominal:     General: Abdomen is flat. Bowel sounds are normal. There is no distension.     Palpations: Abdomen is soft. There is no mass.     Tenderness: There is no abdominal tenderness. There is no guarding or rebound.     Hernia: No hernia is present.  Musculoskeletal:  General: Normal range of motion.     Cervical back: Normal range of motion and neck supple.  Skin:    General: Skin is warm and dry.  Neurological:     General: No focal deficit present.     Mental Status: He is alert and oriented to person, place, and time. Mental status is at baseline.  Psychiatric:        Mood and Affect: Mood normal.        Behavior: Behavior normal.     Physical Activity: Not on file  ; Vital signs: Vitals:   02/27/22 0745 02/27/22 0814  BP: 120/65   Pulse: (!) 59   Resp: 17   Temp:  97.7 F (36.5 C)  SpO2: 100%         GI:  Lab Results: Recent Labs    02/26/22 2054 02/27/22 0539  WBC 22.9* 19.7*  HGB 18.0* 14.3  HCT 51.9 40.9  PLT 487* 370   BMET Recent Labs    02/26/22 2054 02/27/22 0539  NA 139 138  K 4.5 4.8  CL 98 106  CO2 15* 21*  GLUCOSE 139* 116*  BUN 26* 20  CREATININE 2.63* 1.35*  CALCIUM 11.7* 8.9   LFT Recent Labs    02/27/22 0539  PROT 7.0  ALBUMIN 4.3  AST 48*  ALT 24  ALKPHOS 94  BILITOT 1.7*   PT/INR No results for input(s): "LABPROT", "INR" in the last 72 hours.   Studies/Results: CT Chest Wo  Contrast  Result Date: 02/27/2022 CLINICAL DATA:  Esophageal perforation. EXAM: CT CHEST WITHOUT CONTRAST TECHNIQUE: Multidetector CT imaging of the chest was performed following the standard protocol without IV contrast. RADIATION DOSE REDUCTION: This exam was performed according to the departmental dose-optimization program which includes automated exposure control, adjustment of the mA and/or kV according to patient size and/or use of iterative reconstruction technique. COMPARISON:  02/26/2022. FINDINGS: Cardiovascular: The heart is normal in size and there is no pericardial effusion. The aorta and pulmonary trunk are normal in caliber. Mediastinum/Nodes: Extensive pneumomediastinum is noted. Oral contrast is present in the esophagus. No definite contrast extravasation is seen. The thyroid gland and trachea are within normal limits. No mediastinal, hilar, or axillary lymphadenopathy. Lungs/Pleura: Mild atelectasis in the lingular segment of the left upper lobe. No consolidation, effusion, or pneumothorax. Upper Abdomen: No acute abnormality. Contrast is present in the stomach. Musculoskeletal: Extensive subcutaneous emphysema. Air is identified in the epidural space in the thoracic spinal canal. No acute osseous abnormality. IMPRESSION: 1. Extensive pneumomediastinum and subcutaneous emphysema. 2. No definite evidence of esophageal perforation. Conventional upper GI is recommended for follow-up evaluation. Electronically Signed   By: Brett Fairy M.D.   On: 02/27/2022 00:59   CT Soft Tissue Neck Wo Contrast  Result Date: 02/27/2022 CLINICAL DATA:  Vomiting EXAM: CT NECK WITHOUT CONTRAST TECHNIQUE: Multidetector CT imaging of the neck was performed following the standard protocol without intravenous contrast. RADIATION DOSE REDUCTION: This exam was performed according to the departmental dose-optimization program which includes automated exposure control, adjustment of the mA and/or kV according to patient  size and/or use of iterative reconstruction technique. COMPARISON:  None Available. FINDINGS: Pharynx and larynx: Limited assessment for subtle abnormalities because of motion. Salivary glands: Unremarkable Thyroid: Normal Lymph nodes: None enlarged or abnormal density. Vascular: Unremarkable unenhanced appearance Limited intracranial: Normal Visualized orbits: Normal Mastoids and visualized paranasal sinuses: Clear. Skeleton: No acute or aggressive process. Other: There is a large amount of gas tracking through the deep  soft tissues of the neck, the visualized mediastinum and the axillae. There is small volume pneumorrhachis. IMPRESSION: 1. Large amount of gas tracking through the deep soft tissues of the neck, the visualized mediastinum and the axillae. The source is not identified. Electronically Signed   By: Ulyses Jarred M.D.   On: 02/27/2022 00:54   DG Chest Port 1 View  Result Date: 02/27/2022 CLINICAL DATA:  Chest pain EXAM: PORTABLE CHEST 1 VIEW COMPARISON:  09/06/2015, CT of the abdomen from earlier in the same day. FINDINGS: Cardiac shadow is within normal limits. Subcutaneous emphysema is noted bilaterally in the neck with evidence of mild pneumomediastinum. A small pleural line is suggestion in the apices bilaterally which may represent mild pneumothorax. This will be better evaluated on upcoming CT of the chest. No acute bony abnormality is noted. IMPRESSION: Subcutaneous emphysema and pneumomediastinum similar to that seen on prior CT of the abdomen. This will be better evaluated on upcoming chest CT. Electronically Signed   By: Inez Catalina M.D.   On: 02/27/2022 00:28   CT Renal Stone Study  Result Date: 02/27/2022 CLINICAL DATA:  Flank pain. EXAM: CT ABDOMEN AND PELVIS WITHOUT CONTRAST TECHNIQUE: Multidetector CT imaging of the abdomen and pelvis was performed following the standard protocol without IV contrast. RADIATION DOSE REDUCTION: This exam was performed according to the departmental  dose-optimization program which includes automated exposure control, adjustment of the mA and/or kV according to patient size and/or use of iterative reconstruction technique. COMPARISON:  None Available. FINDINGS: Lower chest: A moderate amount of anterior mediastinal air is seen with a small adjacent anteromedial right-sided pneumothorax (maximum AP measurement of the mediastinal air measures 2.1 cm, while the maximum AP measurement of the right-sided pneumothorax measures 1.5 cm). Free air is also seen surrounding the visualized portion of the distal esophagus (approximately 1.1 cm in thickness) and along the region posterior to the descending thoracic aorta (approximately 4 mm in AP measurement). A small amount of air is also seen posterior to portions of the abdominal aorta. Hepatobiliary: No focal liver abnormality is seen. No gallstones, gallbladder wall thickening, or biliary dilatation. Pancreas: Unremarkable. No pancreatic ductal dilatation or surrounding inflammatory changes. Spleen: Normal in size without focal abnormality. Adrenals/Urinary Tract: Adrenal glands are unremarkable. Kidneys are normal, without renal calculi, focal lesion, or hydronephrosis. Bladder is unremarkable. Stomach/Bowel: Stomach is within normal limits. Appendix appears normal. No evidence of bowel wall thickening, distention, or inflammatory changes. Vascular/Lymphatic: No significant vascular findings are present. No enlarged abdominal or pelvic lymph nodes. Reproductive: Uterus and bilateral adnexa are unremarkable. Other: No abdominal wall hernia or abnormality. No abdominopelvic ascites. Musculoskeletal: No acute or significant osseous findings. IMPRESSION: Moderate severity pneumomediastinum with a small adjacent anteromedial right-sided pneumothorax. Further evaluation with neck and chest CT is recommended. Electronically Signed   By: Virgina Norfolk M.D.   On: 02/27/2022 00:07    Impression: Pneumomediastinum  CT  abdomen pelvis without contrast 02/27/2022 Moderate severity pneumomediastinum with a small adjacent anteromedial right-sided pneumothorax. Further evaluation with neck and chest CT is recommended.  Chest x-ray 02/27/2022 Subcutaneous emphysema and pneumomediastinum similar to that seen on prior CT of the abdomen.    CT neck without contrast 02/27/2022 Large amount of gas tracking through the deep soft tissues of the neck, the visualized mediastinum and the axillae.  Source not identified.  CT chest without contrast 02/27/2022 1. Extensive pneumomediastinum and subcutaneous emphysema. 2. No definite evidence of esophageal perforation. Conventional upper GI is recommended for follow-up evaluation.  Pneumomediastinum seen on multiple imaging modalities.  Patient with history of retractable nausea and vomiting after marijuana and excessive alcohol use.  No definite esophageal perforation seen.  Will need further investigation for possible Boerhaave tear.   Plan: We will order Gastrografin upper GI series for possible esophageal perforation.  If obvious perforation is noted will require CT surgery evaluation. Continue antiemetics and pain control as needed. Continue Zosyn 3.375 g every 8 hours. [facility] will follow.   LOS: 0 days   Arvella Nigh Pegi Milazzo  PA-C 02/27/2022, 9:10 AM  Contact #  [phone]

## 2022-02-27 NOTE — ED Provider Notes (Signed)
[place] EMERGENCY DEPT Provider Note   CSN: [id number] Arrival date & time: 02/26/22  2217     History  Chief Complaint  Patient presents with   Emesis   Cramping    [name] [name] is a 21 y.o. male.  The history is provided by the patient.  Emesis Severity:  Severe Duration: hours. Timing:  Intermittent Number of daily episodes:  Greater than 10 Quality:  Stomach contents Progression:  Unchanged Chronicity:  New Urine output: urine is dark, not foamy. Context: not post-tussive   Relieved by:  Nothing Worsened by:  Nothing Associated symptoms: no diarrhea and no fever   Associated symptoms comment:  Abdominal cramping  Risk factors: no sick contacts   Patient with a h/o celiac disease who works doing Biomedical scientist at [employer] presents and severe nausea and vomiting and dehydration having been out in the heat all day.  Patient [place] the beach over [place] holiday weekend and was not drinking much in the way of water while outdoors recreating. The patient returned to [facility] on the 4th of July. At work on the 5th of July out all day in the heat and had approximately 4 cups or water.  Did also have a tick on his scalp a week or so ago. Not embedded.       Home Medications Prior to Admission medications   Medication Sig Start Date End Date Taking? Authorizing Provider  hydrOXYzine (VISTARIL) 25 MG capsule Take 25 mg by mouth every 8 (eight) hours as needed for anxiety (may take an additional 24m at bedtime as needed.). may take an additional 527mat bedtime as needed. Patient not taking: Reported on 04/30/2021    [provider]  lisdexamfetamine (VYVANSE) 40 MG capsule 1 cap every morning with breakfast 08/16/18   NaErlinda HongNP  lisdexamfetamine (VYVANSE) 50 MG capsule Take 50 mg by mouth daily.    [provider]  propranolol (INDERAL) 10 MG tablet Take 10 mg by mouth daily as needed.    [provider]   sertraline (ZOLOFT) 25 MG tablet Take 50 mg by mouth daily.    [provider]      Allergies    Gluten meal    Review of Systems   Review of Systems  Constitutional:  Negative for fever.  HENT:  Negative for facial swelling.   Eyes:  Negative for redness.  Respiratory:  Negative for wheezing and stridor.   Gastrointestinal:  Positive for nausea and vomiting. Negative for diarrhea.       Cramping  Genitourinary:  Negative for dysuria.  All other systems reviewed and are negative.   Physical Exam Updated Vital Signs BP 133/88   Pulse 68   Temp 97.9 F (36.6 C) (Oral)   Resp 16   Ht 6' 3"  (1.905 m)   Wt 89.8 kg   SpO2 99%   BMI 24.75 kg/m  Physical Exam Vitals and nursing note reviewed. Exam conducted with a chaperone present.  Constitutional:      Appearance: Normal appearance. He is well-developed. He is not diaphoretic.  HENT:     Head: Normocephalic and atraumatic.     Nose: Nose normal.  Eyes:     Conjunctiva/sclera: Conjunctivae normal.     Pupils: Pupils are equal, round, and reactive to light.  Cardiovascular:     Rate and Rhythm: Normal rate and regular rhythm.     Pulses: Normal pulses.     Heart sounds: Normal heart sounds.  Pulmonary:     Effort: Pulmonary effort is normal. No accessory muscle usage.     Breath sounds: Decreased breath sounds present. No wheezing or rales.  Abdominal:     General: Abdomen is flat. Bowel sounds are normal.     Palpations: Abdomen is soft.     Tenderness: There is abdominal tenderness. There is no guarding or rebound.     Comments: Mild tenderness   Musculoskeletal:        General: Normal range of motion.     Cervical back: Normal range of motion and neck supple.  Skin:    General: Skin is warm and dry.     Capillary Refill: Capillary refill takes less than 2 seconds.     Comments: No bulls eye rashes   Neurological:     General: No focal deficit present.     Mental Status: He is alert and oriented to  person, place, and time.     Deep Tendon Reflexes: Reflexes normal.  Psychiatric:        Mood and Affect: Mood normal.        Behavior: Behavior normal.     ED Results / Procedures / Treatments   Labs (all labs ordered are listed, but only abnormal results are displayed) Results for orders placed or performed during the hospital encounter of 02/26/22  SARS Coronavirus 2 by RT PCR (hospital order, performed in [facility] hospital lab) *cepheid single result test* Anterior Nasal Swab   Specimen: Anterior Nasal Swab  Result Value Ref Range   SARS Coronavirus 2 by RT PCR NEGATIVE NEGATIVE  Lactic acid, plasma  Result Value Ref Range   Lactic Acid, Venous 1.7 0.5 - 1.9 mmol/L   CT Chest Wo Contrast  Result Date: 02/27/2022 CLINICAL DATA:  Esophageal perforation. EXAM: CT CHEST WITHOUT CONTRAST TECHNIQUE: Multidetector CT imaging of the chest was performed following the standard protocol without IV contrast. RADIATION DOSE REDUCTION: This exam was performed according to the departmental dose-optimization program which includes automated exposure control, adjustment of the mA and/or kV according to patient size and/or use of iterative reconstruction technique. COMPARISON:  02/26/2022. FINDINGS: Cardiovascular: The heart is normal in size and there is no pericardial effusion. The aorta and pulmonary trunk are normal in caliber. Mediastinum/Nodes: Extensive pneumomediastinum is noted. Oral contrast is present in the esophagus. No definite contrast extravasation is seen. The thyroid gland and trachea are within normal limits. No mediastinal, hilar, or axillary lymphadenopathy. Lungs/Pleura: Mild atelectasis in the lingular segment of the left upper lobe. No consolidation, effusion, or pneumothorax. Upper Abdomen: No acute abnormality. Contrast is present in the stomach. Musculoskeletal: Extensive subcutaneous emphysema. Air is identified in the epidural space in the thoracic spinal canal. No acute  osseous abnormality. IMPRESSION: 1. Extensive pneumomediastinum and subcutaneous emphysema. 2. No definite evidence of esophageal perforation. Conventional upper GI is recommended for follow-up evaluation. Electronically Signed   By: Brett Fairy M.D.   On: 02/27/2022 00:59   CT Soft Tissue Neck Wo Contrast  Result Date: 02/27/2022 CLINICAL DATA:  Vomiting EXAM: CT NECK WITHOUT CONTRAST TECHNIQUE: Multidetector CT imaging of the neck was performed following the standard protocol without intravenous contrast. RADIATION DOSE REDUCTION: This exam was performed according to the departmental dose-optimization program which includes automated exposure control, adjustment of the mA and/or kV according to patient size and/or use of iterative reconstruction technique. COMPARISON:  None Available. FINDINGS: Pharynx and larynx: Limited assessment for subtle abnormalities because of motion. Salivary glands: Unremarkable Thyroid: Normal Lymph  nodes: None enlarged or abnormal density. Vascular: Unremarkable unenhanced appearance Limited intracranial: Normal Visualized orbits: Normal Mastoids and visualized paranasal sinuses: Clear. Skeleton: No acute or aggressive process. Other: There is a large amount of gas tracking through the deep soft tissues of the neck, the visualized mediastinum and the axillae. There is small volume pneumorrhachis. IMPRESSION: 1. Large amount of gas tracking through the deep soft tissues of the neck, the visualized mediastinum and the axillae. The source is not identified. Electronically Signed   By: Ulyses Jarred M.D.   On: 02/27/2022 00:54   DG Chest Port 1 View  Result Date: 02/27/2022 CLINICAL DATA:  Chest pain EXAM: PORTABLE CHEST 1 VIEW COMPARISON:  09/06/2015, CT of the abdomen from earlier in the same day. FINDINGS: Cardiac shadow is within normal limits. Subcutaneous emphysema is noted bilaterally in the neck with evidence of mild pneumomediastinum. A small pleural line is suggestion in  the apices bilaterally which may represent mild pneumothorax. This will be better evaluated on upcoming CT of the chest. No acute bony abnormality is noted. IMPRESSION: Subcutaneous emphysema and pneumomediastinum similar to that seen on prior CT of the abdomen. This will be better evaluated on upcoming chest CT. Electronically Signed   By: Inez Catalina M.D.   On: 02/27/2022 00:28   CT Renal Stone Study  Result Date: 02/27/2022 CLINICAL DATA:  Flank pain. EXAM: CT ABDOMEN AND PELVIS WITHOUT CONTRAST TECHNIQUE: Multidetector CT imaging of the abdomen and pelvis was performed following the standard protocol without IV contrast. RADIATION DOSE REDUCTION: This exam was performed according to the departmental dose-optimization program which includes automated exposure control, adjustment of the mA and/or kV according to patient size and/or use of iterative reconstruction technique. COMPARISON:  None Available. FINDINGS: Lower chest: A moderate amount of anterior mediastinal air is seen with a small adjacent anteromedial right-sided pneumothorax (maximum AP measurement of the mediastinal air measures 2.1 cm, while the maximum AP measurement of the right-sided pneumothorax measures 1.5 cm). Free air is also seen surrounding the visualized portion of the distal esophagus (approximately 1.1 cm in thickness) and along the region posterior to the descending thoracic aorta (approximately 4 mm in AP measurement). A small amount of air is also seen posterior to portions of the abdominal aorta. Hepatobiliary: No focal liver abnormality is seen. No gallstones, gallbladder wall thickening, or biliary dilatation. Pancreas: Unremarkable. No pancreatic ductal dilatation or surrounding inflammatory changes. Spleen: Normal in size without focal abnormality. Adrenals/Urinary Tract: Adrenal glands are unremarkable. Kidneys are normal, without renal calculi, focal lesion, or hydronephrosis. Bladder is unremarkable. Stomach/Bowel: Stomach  is within normal limits. Appendix appears normal. No evidence of bowel wall thickening, distention, or inflammatory changes. Vascular/Lymphatic: No significant vascular findings are present. No enlarged abdominal or pelvic lymph nodes. Reproductive: Uterus and bilateral adnexa are unremarkable. Other: No abdominal wall hernia or abnormality. No abdominopelvic ascites. Musculoskeletal: No acute or significant osseous findings. IMPRESSION: Moderate severity pneumomediastinum with a small adjacent anteromedial right-sided pneumothorax. Further evaluation with neck and chest CT is recommended. Electronically Signed   By: Virgina Norfolk M.D.   On: 02/27/2022 00:07     CT Chest Wo Contrast  Result Date: 02/27/2022 CLINICAL DATA:  Esophageal perforation. EXAM: CT CHEST WITHOUT CONTRAST TECHNIQUE: Multidetector CT imaging of the chest was performed following the standard protocol without IV contrast. RADIATION DOSE REDUCTION: This exam was performed according to the departmental dose-optimization program which includes automated exposure control, adjustment of the mA and/or kV according to patient size and/or use  of iterative reconstruction technique. COMPARISON:  02/26/2022. FINDINGS: Cardiovascular: The heart is normal in size and there is no pericardial effusion. The aorta and pulmonary trunk are normal in caliber. Mediastinum/Nodes: Extensive pneumomediastinum is noted. Oral contrast is present in the esophagus. No definite contrast extravasation is seen. The thyroid gland and trachea are within normal limits. No mediastinal, hilar, or axillary lymphadenopathy. Lungs/Pleura: Mild atelectasis in the lingular segment of the left upper lobe. No consolidation, effusion, or pneumothorax. Upper Abdomen: No acute abnormality. Contrast is present in the stomach. Musculoskeletal: Extensive subcutaneous emphysema. Air is identified in the epidural space in the thoracic spinal canal. No acute osseous abnormality.  IMPRESSION: 1. Extensive pneumomediastinum and subcutaneous emphysema. 2. No definite evidence of esophageal perforation. Conventional upper GI is recommended for follow-up evaluation. Electronically Signed   By: Brett Fairy M.D.   On: 02/27/2022 00:59   CT Soft Tissue Neck Wo Contrast  Result Date: 02/27/2022 CLINICAL DATA:  Vomiting EXAM: CT NECK WITHOUT CONTRAST TECHNIQUE: Multidetector CT imaging of the neck was performed following the standard protocol without intravenous contrast. RADIATION DOSE REDUCTION: This exam was performed according to the departmental dose-optimization program which includes automated exposure control, adjustment of the mA and/or kV according to patient size and/or use of iterative reconstruction technique. COMPARISON:  None Available. FINDINGS: Pharynx and larynx: Limited assessment for subtle abnormalities because of motion. Salivary glands: Unremarkable Thyroid: Normal Lymph nodes: None enlarged or abnormal density. Vascular: Unremarkable unenhanced appearance Limited intracranial: Normal Visualized orbits: Normal Mastoids and visualized paranasal sinuses: Clear. Skeleton: No acute or aggressive process. Other: There is a large amount of gas tracking through the deep soft tissues of the neck, the visualized mediastinum and the axillae. There is small volume pneumorrhachis. IMPRESSION: 1. Large amount of gas tracking through the deep soft tissues of the neck, the visualized mediastinum and the axillae. The source is not identified. Electronically Signed   By: Ulyses Jarred M.D.   On: 02/27/2022 00:54   DG Chest Port 1 View  Result Date: 02/27/2022 CLINICAL DATA:  Chest pain EXAM: PORTABLE CHEST 1 VIEW COMPARISON:  09/06/2015, CT of the abdomen from earlier in the same day. FINDINGS: Cardiac shadow is within normal limits. Subcutaneous emphysema is noted bilaterally in the neck with evidence of mild pneumomediastinum. A small pleural line is suggestion in the apices  bilaterally which may represent mild pneumothorax. This will be better evaluated on upcoming CT of the chest. No acute bony abnormality is noted. IMPRESSION: Subcutaneous emphysema and pneumomediastinum similar to that seen on prior CT of the abdomen. This will be better evaluated on upcoming chest CT. Electronically Signed   By: Inez Catalina M.D.   On: 02/27/2022 00:28   CT Renal Stone Study  Result Date: 02/27/2022 CLINICAL DATA:  Flank pain. EXAM: CT ABDOMEN AND PELVIS WITHOUT CONTRAST TECHNIQUE: Multidetector CT imaging of the abdomen and pelvis was performed following the standard protocol without IV contrast. RADIATION DOSE REDUCTION: This exam was performed according to the departmental dose-optimization program which includes automated exposure control, adjustment of the mA and/or kV according to patient size and/or use of iterative reconstruction technique. COMPARISON:  None Available. FINDINGS: Lower chest: A moderate amount of anterior mediastinal air is seen with a small adjacent anteromedial right-sided pneumothorax (maximum AP measurement of the mediastinal air measures 2.1 cm, while the maximum AP measurement of the right-sided pneumothorax measures 1.5 cm). Free air is also seen surrounding the visualized portion of the distal esophagus (approximately 1.1 cm in thickness) and  along the region posterior to the descending thoracic aorta (approximately 4 mm in AP measurement). A small amount of air is also seen posterior to portions of the abdominal aorta. Hepatobiliary: No focal liver abnormality is seen. No gallstones, gallbladder wall thickening, or biliary dilatation. Pancreas: Unremarkable. No pancreatic ductal dilatation or surrounding inflammatory changes. Spleen: Normal in size without focal abnormality. Adrenals/Urinary Tract: Adrenal glands are unremarkable. Kidneys are normal, without renal calculi, focal lesion, or hydronephrosis. Bladder is unremarkable. Stomach/Bowel: Stomach is within  normal limits. Appendix appears normal. No evidence of bowel wall thickening, distention, or inflammatory changes. Vascular/Lymphatic: No significant vascular findings are present. No enlarged abdominal or pelvic lymph nodes. Reproductive: Uterus and bilateral adnexa are unremarkable. Other: No abdominal wall hernia or abnormality. No abdominopelvic ascites. Musculoskeletal: No acute or significant osseous findings. IMPRESSION: Moderate severity pneumomediastinum with a small adjacent anteromedial right-sided pneumothorax. Further evaluation with neck and chest CT is recommended. Electronically Signed   By: Virgina Norfolk M.D.   On: 02/27/2022 00:07     Radiology CT Chest Wo Contrast  Result Date: 02/27/2022 CLINICAL DATA:  Esophageal perforation. EXAM: CT CHEST WITHOUT CONTRAST TECHNIQUE: Multidetector CT imaging of the chest was performed following the standard protocol without IV contrast. RADIATION DOSE REDUCTION: This exam was performed according to the departmental dose-optimization program which includes automated exposure control, adjustment of the mA and/or kV according to patient size and/or use of iterative reconstruction technique. COMPARISON:  02/26/2022. FINDINGS: Cardiovascular: The heart is normal in size and there is no pericardial effusion. The aorta and pulmonary trunk are normal in caliber. Mediastinum/Nodes: Extensive pneumomediastinum is noted. Oral contrast is present in the esophagus. No definite contrast extravasation is seen. The thyroid gland and trachea are within normal limits. No mediastinal, hilar, or axillary lymphadenopathy. Lungs/Pleura: Mild atelectasis in the lingular segment of the left upper lobe. No consolidation, effusion, or pneumothorax. Upper Abdomen: No acute abnormality. Contrast is present in the stomach. Musculoskeletal: Extensive subcutaneous emphysema. Air is identified in the epidural space in the thoracic spinal canal. No acute osseous abnormality.  IMPRESSION: 1. Extensive pneumomediastinum and subcutaneous emphysema. 2. No definite evidence of esophageal perforation. Conventional upper GI is recommended for follow-up evaluation. Electronically Signed   By: Brett Fairy M.D.   On: 02/27/2022 00:59   CT Soft Tissue Neck Wo Contrast  Result Date: 02/27/2022 CLINICAL DATA:  Vomiting EXAM: CT NECK WITHOUT CONTRAST TECHNIQUE: Multidetector CT imaging of the neck was performed following the standard protocol without intravenous contrast. RADIATION DOSE REDUCTION: This exam was performed according to the departmental dose-optimization program which includes automated exposure control, adjustment of the mA and/or kV according to patient size and/or use of iterative reconstruction technique. COMPARISON:  None Available. FINDINGS: Pharynx and larynx: Limited assessment for subtle abnormalities because of motion. Salivary glands: Unremarkable Thyroid: Normal Lymph nodes: None enlarged or abnormal density. Vascular: Unremarkable unenhanced appearance Limited intracranial: Normal Visualized orbits: Normal Mastoids and visualized paranasal sinuses: Clear. Skeleton: No acute or aggressive process. Other: There is a large amount of gas tracking through the deep soft tissues of the neck, the visualized mediastinum and the axillae. There is small volume pneumorrhachis. IMPRESSION: 1. Large amount of gas tracking through the deep soft tissues of the neck, the visualized mediastinum and the axillae. The source is not identified. Electronically Signed   By: Ulyses Jarred M.D.   On: 02/27/2022 00:54   DG Chest Port 1 View  Result Date: 02/27/2022 CLINICAL DATA:  Chest pain EXAM: PORTABLE CHEST 1 VIEW  COMPARISON:  09/06/2015, CT of the abdomen from earlier in the same day. FINDINGS: Cardiac shadow is within normal limits. Subcutaneous emphysema is noted bilaterally in the neck with evidence of mild pneumomediastinum. A small pleural line is suggestion in the apices  bilaterally which may represent mild pneumothorax. This will be better evaluated on upcoming CT of the chest. No acute bony abnormality is noted. IMPRESSION: Subcutaneous emphysema and pneumomediastinum similar to that seen on prior CT of the abdomen. This will be better evaluated on upcoming chest CT. Electronically Signed   By: Inez Catalina M.D.   On: 02/27/2022 00:28   CT Renal Stone Study  Result Date: 02/27/2022 CLINICAL DATA:  Flank pain. EXAM: CT ABDOMEN AND PELVIS WITHOUT CONTRAST TECHNIQUE: Multidetector CT imaging of the abdomen and pelvis was performed following the standard protocol without IV contrast. RADIATION DOSE REDUCTION: This exam was performed according to the departmental dose-optimization program which includes automated exposure control, adjustment of the mA and/or kV according to patient size and/or use of iterative reconstruction technique. COMPARISON:  None Available. FINDINGS: Lower chest: A moderate amount of anterior mediastinal air is seen with a small adjacent anteromedial right-sided pneumothorax (maximum AP measurement of the mediastinal air measures 2.1 cm, while the maximum AP measurement of the right-sided pneumothorax measures 1.5 cm). Free air is also seen surrounding the visualized portion of the distal esophagus (approximately 1.1 cm in thickness) and along the region posterior to the descending thoracic aorta (approximately 4 mm in AP measurement). A small amount of air is also seen posterior to portions of the abdominal aorta. Hepatobiliary: No focal liver abnormality is seen. No gallstones, gallbladder wall thickening, or biliary dilatation. Pancreas: Unremarkable. No pancreatic ductal dilatation or surrounding inflammatory changes. Spleen: Normal in size without focal abnormality. Adrenals/Urinary Tract: Adrenal glands are unremarkable. Kidneys are normal, without renal calculi, focal lesion, or hydronephrosis. Bladder is unremarkable. Stomach/Bowel: Stomach is within  normal limits. Appendix appears normal. No evidence of bowel wall thickening, distention, or inflammatory changes. Vascular/Lymphatic: No significant vascular findings are present. No enlarged abdominal or pelvic lymph nodes. Reproductive: Uterus and bilateral adnexa are unremarkable. Other: No abdominal wall hernia or abnormality. No abdominopelvic ascites. Musculoskeletal: No acute or significant osseous findings. IMPRESSION: Moderate severity pneumomediastinum with a small adjacent anteromedial right-sided pneumothorax. Further evaluation with neck and chest CT is recommended. Electronically Signed   By: Virgina Norfolk M.D.   On: 02/27/2022 00:07    Procedures Procedures    Medications Ordered in ED Medications  lactated ringers infusion (has no administration in time range)  metroNIDAZOLE (FLAGYL) IVPB 500 mg (500 mg Intravenous New Bag/Given 02/27/22 0133)  sodium chloride 0.9 % bolus 1,000 mL (1,000 mLs Intravenous New Bag/Given 02/27/22 0034)    And  sodium chloride 0.9 % bolus 1,000 mL (1,000 mLs Intravenous New Bag/Given 02/27/22 0131)    And  sodium chloride 0.9 % bolus 1,000 mL (has no administration in time range)  vancomycin (VANCOCIN) IVPB 1000 mg/200 mL premix (has no administration in time range)  ondansetron (ZOFRAN) injection 4 mg (4 mg Intravenous Given 02/26/22 2243)  sodium chloride 0.9 % bolus 2,000 mL (0 mLs Intravenous Stopped 02/26/22 2321)  ceFEPIme (MAXIPIME) 2 g in sodium chloride 0.9 % 100 mL IVPB (0 g Intravenous Stopped 02/27/22 0120)    ED Course/ Medical Decision Making/ A&P                           Medical Decision Making  Patient with nausea vomiting having worked out in the heat with not much water intake   Problems Addressed: AKI (acute kidney injury) ([place]): acute illness or injury    Details: IV boluses given.  Will need recheck of labs Dehydration: acute illness or injury    Details: IV boluses given Elevated bilirubin:    Details: May be due to stress  or Gilbert's will need follow up Heat exhaustion, initial encounter: acute illness or injury    Details: zofran given IV boluses give Nausea and vomiting, unspecified vomiting type: acute illness or injury    Details: zofran given IV boluses given, imaging obtained Pneumomediastinum [facility]): acute illness or injury    Details: consult to thoracic surgery who will follow Pneumothorax, unspecified type: acute illness or injury    Details: on oxygen, consult to thoracic surgery who will follow Sepsis, due to unspecified organism, unspecified whether acute organ dysfunction present [facility]): acute illness or injury    Details: sepsis bundle initiated. Cultures obtained.  IV boluses given.  Broad spectrum antibiotics given in the ED  Amount and/or Complexity of Data Reviewed Independent Historian: parent    Details: see above External Data Reviewed: labs and notes.    Details: previous notes and kidney function reviewed:  baseline function is .8 Labs: ordered.    Details: all labs reviewed: white count is elevated at 22.9, hemoglobin elevated at 18.1, elevated platelets.  Urine is consistent with dehydration without UTI. Lactate is normal but this is following IVF.  Normal sodium and potassium,  elevated BUN 26 and creatinine 2.63.  Elevated AST and bilirubin 2.7 Radiology: ordered and independent interpretation performed.    Details: extensive mediastinal air by me on CT neck and chest, done without contrast due to creatinine.  No stones in the abdomen by me.  No acute bowel pathology by me Discussion of management or test interpretation with external provider(s): 137 Case d/w Dr. Cyndia Bent, keep patient NPO, antibiotics and thoracic will follow.   Case d/w Dr. Atlee Abide of ICU, ED to ED to see ICU, please call upon arrival  Case d/w Dr. Tyrone Nine who will accept the patient in transfer   Risk Prescription drug management. Decision regarding hospitalization. Risk Details: Patient will need to see the ICU  attending upon arrival at [facility].    Critical Care Total time providing critical care: 75 minutes (Multiple bolused and multiple antibiotics based on sepsis bundle. In addition to complexity of care and diagnoses as well as multiple consults as well as multiple visits with patient to update patient and family )    Final Clinical Impression(s) / ED Diagnoses Final diagnoses:  Heat exhaustion, initial encounter  Dehydration  Nausea and vomiting, unspecified vomiting type  AKI (acute kidney injury) ([place])  Non-traumatic rhabdomyolysis  Sepsis, due to unspecified organism, unspecified whether acute organ dysfunction present ([place])  Elevated bilirubin  Pneumothorax, unspecified type  Boerhaave syndrome    Case d/w Dr. Tyrone Nine who will accept the patient in transfer. ICU to be contacted on arrival.    Patient will need testing/ treatment for tick borne illness   Rx / DC Orders ED Discharge Orders     None         Quintessa Simmerman, MD 02/27/22 8527

## 2022-02-27 NOTE — ED Notes (Signed)
Pt now returned from [place] via stretcher

## 2022-02-28 ENCOUNTER — Other Ambulatory Visit (HOSPITAL_COMMUNITY): Payer: Self-pay

## 2022-02-28 DIAGNOSIS — M6282 Rhabdomyolysis: Secondary | ICD-10-CM

## 2022-02-28 DIAGNOSIS — J982 Interstitial emphysema: Secondary | ICD-10-CM | POA: Diagnosis not present

## 2022-02-28 LAB — COMPREHENSIVE METABOLIC PANEL
ALT: 28 U/L (ref 0–44)
AST: 57 U/L — ABNORMAL HIGH (ref 15–41)
Albumin: 3.8 g/dL (ref 3.5–5.0)
Alkaline Phosphatase: 75 U/L (ref 38–126)
Anion gap: 12 (ref 5–15)
BUN: 8 mg/dL (ref 6–20)
CO2: 26 mmol/L (ref 22–32)
Calcium: 9.4 mg/dL (ref 8.9–10.3)
Chloride: 102 mmol/L (ref 98–111)
Creatinine, Ser: 0.97 mg/dL (ref 0.61–1.24)
GFR, Estimated: >60 mL/min (ref >60)
Glucose, Bld: 99 mg/dL (ref 70–99)
Potassium: 3.9 mmol/L (ref 3.5–5.1)
Sodium: 140 mmol/L (ref 135–145)
Total Bilirubin: 1.7 mg/dL — ABNORMAL HIGH (ref 0.3–1.2)
Total Protein: 6.1 g/dL — ABNORMAL LOW (ref 6.5–8.1)

## 2022-02-28 LAB — CBC WITH DIFFERENTIAL/PLATELET
Abs Immature Granulocytes: 0.04 10*3/uL (ref 0.00–0.07)
Basophils Absolute: 0 10*3/uL (ref 0.0–0.1)
Basophils Relative: 0 %
Eosinophils Absolute: 0.1 10*3/uL (ref 0.0–0.5)
Eosinophils Relative: 1 %
HCT: 40 % (ref 39.0–52.0)
Hemoglobin: 13.8 g/dL (ref 13.0–17.0)
Immature Granulocytes: 0 %
Lymphocytes Relative: 38 %
Lymphs Abs: 3.7 10*3/uL (ref 0.7–4.0)
MCH: 30.9 pg (ref 26.0–34.0)
MCHC: 34.5 g/dL (ref 30.0–36.0)
MCV: 89.5 fL (ref 80.0–100.0)
Monocytes Absolute: 0.8 10*3/uL (ref 0.1–1.0)
Monocytes Relative: 8 %
Neutro Abs: 5.2 10*3/uL (ref 1.7–7.7)
Neutrophils Relative %: 53 %
Platelets: 291 10*3/uL (ref 150–400)
RBC: 4.47 MIL/uL (ref 4.22–5.81)
RDW: 12.4 % (ref 11.5–15.5)
WBC: 9.9 10*3/uL (ref 4.0–10.5)
nRBC: 0 % (ref 0.0–0.2)

## 2022-02-28 LAB — GLUCOSE, CAPILLARY
Glucose-Capillary: 86 mg/dL (ref 70–99)
Glucose-Capillary: 93 mg/dL (ref 70–99)

## 2022-02-28 MED ORDER — SERTRALINE HCL 50 MG PO TABS
50.0000 mg | ORAL_TABLET | Freq: Every morning | ORAL | Status: DC
  Administered 2022-02-28: 50 mg via ORAL
  Filled 2022-02-28: qty 1

## 2022-02-28 MED ORDER — ONDANSETRON HCL 4 MG PO TABS
4.0000 mg | ORAL_TABLET | Freq: Three times a day (TID) | ORAL | 0 refills | Status: DC | PRN
  Filled 2022-02-28: qty 20, 7d supply, fill #0

## 2022-02-28 NOTE — Discharge Summary (Signed)
Physician Discharge Summary         Patient ID: [name] [name] MRN: [id number] DOB/AGE: [birth year] 21 y.o.  Admit date: 02/26/2022 Discharge date: 02/28/2022  Discharge Diagnoses:    Active Hospital Problems   Diagnosis Date Noted   Pneumomediastinum ([place]) 02/27/2022    Resolved Hospital Problems  No resolved problems to display.      Discharge summary    Discharge Plan by Active Problems    Pneumomediastinum  Secondary to intractable nausea and vomiting presumably in the setting of heat exhaustion. 7/6 Upper GI series negative for esophageal or upper GI perforation. Suspect initial cause was microperforation that was too small for visualization, iso retching. Tolerated CLD well since 7/6, no nausea, vomiting, or coughing; feeling well. Advanced to regular diet (gluten free for Celiac) and tolerated well on 7/7. Also seen by GI and clear for discharge from their standpoint. WBC have normalized and pt has remained afebrile and clinically very well appearing.  - No further antibiotics - Continue gluten-free diet at home for known Celiac disease - Advised avoidance of strenuous activity, scuba diving, excessive straining over next 6-8 weeks - Have set pt up for outpatient Pulm follow up next week with repeat CXR, as below   AKI (resolved):  Secondary to dehydration + rhabdo with CK bump to 1253 on admission. Cr was 2.63 on admission, and normalized on 7/7 with good urine output, after volume resuscitation. Other electrolytes wnl.   Celiac disease Gluten free diet given here and recommended at discharge.   Significant Hospital tests/ studies   Full record of imaging below, but importantly, original CT Neck and Chest on 7/6 demonstrated pneumomediastinum, and 7/6 DG UGI series was negative for esophageal perforation.   Procedures   None Culture data/antimicrobials   Blood cultures x2 negative on preliminary read, MRSA negative. Received Cefepime and Flagyl on 7/6, Zosyn  7/6-7/7. Antibiotics discontinued after negative upper GI series was reviewed, alongside patient's clinical improvement.   Consults    [facility] was consulted and appreciate recs.  Discharge Exam: BP 117/76   Pulse 69   Temp 98 F (36.7 C)   Resp 16   Ht 6' 3"  (1.905 m)   Wt 89.2 kg   SpO2 97%   BMI 24.58 kg/m   General: Pleasant, well-appearing young male sitting up comfortably in bed. No acute distress. Head: Normocephalic. Atraumatic. CV: RRR. No murmurs, rubs, or gallops. No LE edema Pulmonary: Lungs CTAB. Normal effort. No wheezing or rales. Abdominal: Soft, nontender, nondistended. Normal bowel sounds. Extremities: Palpable radial and DP pulses. Normal ROM. Skin: Warm and dry. No obvious rash or lesions. No further demonstration of cervical crepitus. Neuro: A&Ox3. Moves all extremities. Normal sensation. No focal deficit. Psych: Normal mood and affect   Labs at discharge   Lab Results  Component Value Date   CREATININE 0.97 02/28/2022   BUN 8 02/28/2022   NA 140 02/28/2022   K 3.9 02/28/2022   CL 102 02/28/2022   CO2 26 02/28/2022   Lab Results  Component Value Date   WBC 9.9 02/28/2022   HGB 13.8 02/28/2022   HCT 40.0 02/28/2022   MCV 89.5 02/28/2022   PLT 291 02/28/2022   Lab Results  Component Value Date   ALT 28 02/28/2022   AST 57 (H) 02/28/2022   ALKPHOS 75 02/28/2022   BILITOT 1.7 (H) 02/28/2022   No results found for: "INR", "PROTIME"  Current radiological studies    DG UGI W SINGLE CM (SOL OR THIN  BA)  Result Date: 02/27/2022 CLINICAL DATA:  Intractable vomiting. Pneumomediastinum. Evaluate for esophageal perforation. EXAM: WATER SOLUBLE UPPER GI SERIES TECHNIQUE: Single-column upper GI series was performed using water soluble Omnipaque 300 contrast. CONTRAST:  278m OMNIPAQUE IOHEXOL 300 MG/ML  SOLN COMPARISON:  None Available. FLUOROSCOPY: Fluoroscopy Time:  2 minutes 54 seconds FINDINGS: The scout image shows a small amount of oral contrast  material within the colon. No evidence of dilated bowel loops. Pneumomediastinum is noted as well as subcutaneous emphysema in neck. The esophagus is normal in appearance. There is no evidence of leak or extravasation of contrast material from the esophagus. No evidence of esophageal stricture or hiatal hernia. Moderate gastroesophageal reflux was seen to the level of the proximal thoracic esophagus. The stomach and duodenal sweep are normal in appearance, without evidence of contrast leak or extravasation. Prompt gastric emptying is seen. Normal position of the DJJ in the left upper quadrant. IMPRESSION: Pneumomediastinum. No evidence of esophageal or upper GI leak/perforation. Moderate gastroesophageal reflux incidentally noted. Electronically Signed   By: JMarlaine HindM.D.   On: 02/27/2022 13:00   CT Chest Wo Contrast  Result Date: 02/27/2022 CLINICAL DATA:  Esophageal perforation. EXAM: CT CHEST WITHOUT CONTRAST TECHNIQUE: Multidetector CT imaging of the chest was performed following the standard protocol without IV contrast. RADIATION DOSE REDUCTION: This exam was performed according to the departmental dose-optimization program which includes automated exposure control, adjustment of the mA and/or kV according to patient size and/or use of iterative reconstruction technique. COMPARISON:  02/26/2022. FINDINGS: Cardiovascular: The heart is normal in size and there is no pericardial effusion. The aorta and pulmonary trunk are normal in caliber. Mediastinum/Nodes: Extensive pneumomediastinum is noted. Oral contrast is present in the esophagus. No definite contrast extravasation is seen. The thyroid gland and trachea are within normal limits. No mediastinal, hilar, or axillary lymphadenopathy. Lungs/Pleura: Mild atelectasis in the lingular segment of the left upper lobe. No consolidation, effusion, or pneumothorax. Upper Abdomen: No acute abnormality. Contrast is present in the stomach. Musculoskeletal:  Extensive subcutaneous emphysema. Air is identified in the epidural space in the thoracic spinal canal. No acute osseous abnormality. IMPRESSION: 1. Extensive pneumomediastinum and subcutaneous emphysema. 2. No definite evidence of esophageal perforation. Conventional upper GI is recommended for follow-up evaluation. Electronically Signed   By: LBrett FairyM.D.   On: 02/27/2022 00:59   CT Soft Tissue Neck Wo Contrast  Result Date: 02/27/2022 CLINICAL DATA:  Vomiting EXAM: CT NECK WITHOUT CONTRAST TECHNIQUE: Multidetector CT imaging of the neck was performed following the standard protocol without intravenous contrast. RADIATION DOSE REDUCTION: This exam was performed according to the departmental dose-optimization program which includes automated exposure control, adjustment of the mA and/or kV according to patient size and/or use of iterative reconstruction technique. COMPARISON:  None Available. FINDINGS: Pharynx and larynx: Limited assessment for subtle abnormalities because of motion. Salivary glands: Unremarkable Thyroid: Normal Lymph nodes: None enlarged or abnormal density. Vascular: Unremarkable unenhanced appearance Limited intracranial: Normal Visualized orbits: Normal Mastoids and visualized paranasal sinuses: Clear. Skeleton: No acute or aggressive process. Other: There is a large amount of gas tracking through the deep soft tissues of the neck, the visualized mediastinum and the axillae. There is small volume pneumorrhachis. IMPRESSION: 1. Large amount of gas tracking through the deep soft tissues of the neck, the visualized mediastinum and the axillae. The source is not identified. Electronically Signed   By: KUlyses JarredM.D.   On: 02/27/2022 00:54   [facility]  Result  Date: 02/27/2022 CLINICAL DATA:  Chest pain EXAM: PORTABLE CHEST 1 VIEW COMPARISON:  09/06/2015, CT of the abdomen from earlier in the same day. FINDINGS: Cardiac shadow is within normal limits. Subcutaneous emphysema  is noted bilaterally in the neck with evidence of mild pneumomediastinum. A small pleural line is suggestion in the apices bilaterally which may represent mild pneumothorax. This will be better evaluated on upcoming CT of the chest. No acute bony abnormality is noted. IMPRESSION: Subcutaneous emphysema and pneumomediastinum similar to that seen on prior CT of the abdomen. This will be better evaluated on upcoming chest CT. Electronically Signed   By: Inez Catalina M.D.   On: 02/27/2022 00:28   CT Renal Stone Study  Result Date: 02/27/2022 CLINICAL DATA:  Flank pain. EXAM: CT ABDOMEN AND PELVIS WITHOUT CONTRAST TECHNIQUE: Multidetector CT imaging of the abdomen and pelvis was performed following the standard protocol without IV contrast. RADIATION DOSE REDUCTION: This exam was performed according to the departmental dose-optimization program which includes automated exposure control, adjustment of the mA and/or kV according to patient size and/or use of iterative reconstruction technique. COMPARISON:  None Available. FINDINGS: Lower chest: A moderate amount of anterior mediastinal air is seen with a small adjacent anteromedial right-sided pneumothorax (maximum AP measurement of the mediastinal air measures 2.1 cm, while the maximum AP measurement of the right-sided pneumothorax measures 1.5 cm). Free air is also seen surrounding the visualized portion of the distal esophagus (approximately 1.1 cm in thickness) and along the region posterior to the descending thoracic aorta (approximately 4 mm in AP measurement). A small amount of air is also seen posterior to portions of the abdominal aorta. Hepatobiliary: No focal liver abnormality is seen. No gallstones, gallbladder wall thickening, or biliary dilatation. Pancreas: Unremarkable. No pancreatic ductal dilatation or surrounding inflammatory changes. Spleen: Normal in size without focal abnormality. Adrenals/Urinary Tract: Adrenal glands are unremarkable. Kidneys are  normal, without renal calculi, focal lesion, or hydronephrosis. Bladder is unremarkable. Stomach/Bowel: Stomach is within normal limits. Appendix appears normal. No evidence of bowel wall thickening, distention, or inflammatory changes. Vascular/Lymphatic: No significant vascular findings are present. No enlarged abdominal or pelvic lymph nodes. Reproductive: Uterus and bilateral adnexa are unremarkable. Other: No abdominal wall hernia or abnormality. No abdominopelvic ascites. Musculoskeletal: No acute or significant osseous findings. IMPRESSION: Moderate severity pneumomediastinum with a small adjacent anteromedial right-sided pneumothorax. Further evaluation with neck and chest CT is recommended. Electronically Signed   By: Virgina Norfolk M.D.   On: 02/27/2022 00:07    Disposition:   Discharge disposition: 01-Home or Self Care     Home on 7/7 with return precautions given.  Again, advised against weight bearing exercises, straining, scuba diving, and advised for adequate hydration.   Discharge Instructions     Call MD for:  difficulty breathing, headache or visual disturbances   Complete by: As directed    Call MD for:  extreme fatigue   Complete by: As directed    Call MD for:  persistant dizziness or light-headedness   Complete by: As directed    Call MD for:  persistant nausea and vomiting   Complete by: As directed    Call MD for:  redness, tenderness, or signs of infection (pain, swelling, redness, odor or green/yellow discharge around incision site)   Complete by: As directed    Call MD for:  severe uncontrolled pain   Complete by: As directed    Call MD for:  temperature >100.4   Complete by: As directed  Diet - low sodium heart healthy   Complete by: As directed    Increase activity slowly   Complete by: As directed       You were admitted to [facility] ICU after being seen to have something called "pneumomediastinum," or air in part of the thoracic  cavity. After substantial work up with imaging and labs, the most likely explanation is that you had a very small microperforation in the esophagus from significant vomiting. It will be important to limit your exposure to things that can make you vomit. After confirming tolerance of regular food, and having you seen by the GI experts, and seeing normalization of your lab findings (kidney function and white blood cells in particular) in conjunction with symptomatic improvement (no further nausea and vomiting episodes) we feel that you are safe for discharge home with follow up imaging of a chest XR next week, which has already been scheduled. You are being discharged with Zofran to take as needed for nausea. This is to prevent vomiting and further stress and pressure on your esophagus. Please also avoid heavy weight lifting, active straining, and scuba diving over the next 6-8 weeks.   Allergies as of 02/28/2022       Reactions   Gluten Meal Other (See Comments)   Celiac disease        Medication List     STOP taking these medications    propranolol 10 MG tablet Commonly known as: INDERAL       TAKE these medications    ondansetron 4 MG tablet Commonly known as: Zofran Take 1 tablet (4 mg total) by mouth every 8 (eight) hours as needed for nausea or vomiting.   sertraline 50 MG tablet Commonly known as: ZOLOFT Take 50 mg by mouth every morning.         Follow-up appointment    Will follow up with Katy Cobb on 03/05/22 at 2:30pm with a Chest XR.   Discharge Condition:    good    Signed:  Baldwin Jamaica, MS4

## 2022-02-28 NOTE — Progress Notes (Signed)
[facility] Progress Note  [name] [name] 21 y.o. 2001/04/13   Subjective: Patient seen and examined laying in bed.  Patient denies abdominal pain.  Notes one bowel movement this morning without blood.  Patient is able to tolerate clear liquid diet well.  ROS : Review of Systems  Gastrointestinal:  Negative for abdominal pain, blood in stool, constipation, diarrhea, heartburn, melena, nausea and vomiting.  Genitourinary:  Negative for dysuria and urgency.    Objective: Vital signs in last 24 hours: Vitals:   02/28/22 1000 02/28/22 1129  BP: 115/67   Pulse: 70   Resp: 12   Temp:  98 F (36.7 C)  SpO2: 98%     Physical Exam:  General:  Alert, cooperative, no distress, appears stated age  Head:  Normocephalic, without obvious abnormality, atraumatic  Eyes:  Anicteric sclera, EOM's intact  Lungs:   Clear to auscultation bilaterally, respirations unlabored  Heart:  Regular rate and rhythm, S1, S2 normal  Abdomen:   Soft, non-tender, bowel sounds active all four quadrants,  no masses,   Extremities: Extremities normal, atraumatic, no  edema  Pulses: 2+ and symmetric    Lab Results: Recent Labs    02/27/22 0539 02/28/22 0413  NA 138 140  K 4.8 3.9  CL 106 102  CO2 21* 26  GLUCOSE 116* 99  BUN 20 8  CREATININE 1.35* 0.97  CALCIUM 8.9 9.4  MG 2.1  --   PHOS 5.3*  --    Recent Labs    02/27/22 0539 02/28/22 0413  AST 48* 57*  ALT 24 28  ALKPHOS 94 75  BILITOT 1.7* 1.7*  PROT 7.0 6.1*  ALBUMIN 4.3 3.8   Recent Labs    02/26/22 2054 02/27/22 0539 02/28/22 0413  WBC 22.9* 19.7* 9.9  NEUTROABS 20.1*  --  5.2  HGB 18.0* 14.3 13.8  HCT 51.9 40.9 40.0  MCV 88.6 89.7 89.5  PLT 487* 370 291   No results for input(s): "LABPROT", "INR" in the last 72 hours.    Assessment Pneumomediastinum   CT abdomen pelvis without contrast 02/27/2022 Moderate severity pneumomediastinum with a small adjacent anteromedial right-sided pneumothorax. Further  evaluation with neck and chest CT is recommended.   Chest x-ray 02/27/2022 Subcutaneous emphysema and pneumomediastinum similar to that seen on prior CT of the abdomen.     CT neck without contrast 02/27/2022 Large amount of gas tracking through the deep soft tissues of the neck, the visualized mediastinum and the axillae.  Source not identified.   CT chest without contrast 02/27/2022 1. Extensive pneumomediastinum and subcutaneous emphysema. 2. No definite evidence of esophageal perforation. Conventional upper GI is recommended for follow-up evaluation.  Gastrografin upper GI series 02/27/2022 Pneumomediastinum. No evidence of esophageal or upper GI leak/perforation. Moderate gastroesophageal reflux incidentally noted.   Pneumomediastinum seen on multiple imaging modalities.  Patient with history of retractable nausea and vomiting after marijuana and excessive alcohol use.  No perforation or leak in esophagus on upper GI series.  Patient tolerating clear liquid diet well.   Plan: Progress diet to regular diet. Continue antiemetics and pain control as needed. [facility] will sign off.   Arvella Nigh [name] PA-C 02/28/2022, 12:23 PM  Contact #  [phone]

## 2022-02-28 NOTE — Progress Notes (Signed)
All lines and IV's taken out, pt is being discharge with Mom, home medications have been given and pt was discharge safely.

## 2022-02-28 NOTE — Discharge Instructions (Signed)
You were admitted to [facility] ICU after being seen to have something called "pneumomediastinum," or air in part of the thoracic cavity. After substantial work up with imaging and labs, the most likely explanation is that you had a very small microperforation in the esophagus from significant vomiting. It will be important to limit your exposure to things that can make you vomit. After confirming tolerance of regular food, and having you seen by the GI experts, and seeing normalization of your lab findings (kidney function and white blood cells in particular) in conjunction with symptomatic improvement (no further nausea and vomiting episodes) we feel that you are safe for discharge home with follow up imaging of a chest XR next week, which has already been scheduled. You are being discharged with Zofran to take as needed for nausea. This is to prevent vomiting and further stress and pressure on your esophagus. Please also avoid heavy weight lifting, active straining, and scuba diving over the next 6-8 weeks.

## 2022-02-28 NOTE — Plan of Care (Signed)
  Problem: Education: Goal: Knowledge of General Education information will improve Description: Including pain rating scale, medication(s)/side effects and non-pharmacologic comfort measures Outcome: Adequate for Discharge   Problem: Health Behavior/Discharge Planning: Goal: Ability to manage health-related needs will improve Outcome: Adequate for Discharge   Problem: Clinical Measurements: Goal: Ability to maintain clinical measurements within normal limits will improve Outcome: Adequate for Discharge Goal: Will remain free from infection Outcome: Adequate for Discharge Goal: Diagnostic test results will improve Outcome: Adequate for Discharge Goal: Respiratory complications will improve Outcome: Adequate for Discharge Goal: Cardiovascular complication will be avoided Outcome: Adequate for Discharge   Problem: Elimination: Goal: Will not experience complications related to bowel motility Outcome: Adequate for Discharge Goal: Will not experience complications related to urinary retention Outcome: Adequate for Discharge   Problem: Pain Managment: Goal: General experience of comfort will improve Outcome: Adequate for Discharge   Problem: Safety: Goal: Ability to remain free from injury will improve Outcome: Adequate for Discharge   Problem: Skin Integrity: Goal: Risk for impaired skin integrity will decrease Outcome: Adequate for Discharge

## 2022-02-28 NOTE — Progress Notes (Signed)
NAMEDenis [name], MRN:  [id number], DOB:  Sep 23, 2000, LOS: 1 ADMISSION DATE:  02/26/2022, CONSULTATION DATE:  7/6 REFERRING MD:  Dr Randal Buba , CHIEF COMPLAINT:  Pneumomediastinum   History of Present Illness:  21 year old male with past medical history as below, which is significant for celiac disease and GERD.  He presented to [facility] 7/5 with complaints of intractable nausea and vomiting for several hours.  Notably he was at the beach the weekend prior to presentation and spent a lot of time in the heat without adequate hydration.  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  Around 3 PM he developed nausea and vomiting.  He was unable to keep down any fluids or solids.  He also experience bad abdominal cramping as well as muscle cramping in the upper lower extremities. He had approximately 20 episodes of vomiting in a 4 hour period refractory to Zofran, for which reason he presented to ED. Laboratory evaluation significant for multiple metabolic derrangements. CXR done in the ED demonstrated subcutaneous emphysema, which prompted CT. CT showed extensive pneumomediastinum and subcutaneous emphysema without definite evidence of esophageal perforation. PCCM was asked to admit for close monitoring.   Pertinent  Medical History   has a past medical history of Allergy, Celiac disease, GERD (gastroesophageal reflux disease), GI symptoms, and Pneumonia.   Significant Hospital Events: Including procedures, antibiotic start and stop dates in addition to other pertinent events   7/5 admit for pneumomediastinum and npo 7/6 Upper GI series with gastrografin negative for perforation, advanced to CLD which was tolerated  Interim History / Subjective:   Anterrio notes that he is feeling well overall today. He denies any further nausea or vomiting in the last 24+ hours. He denies any chest pain, SOB, or abdominal pain, has been urinating well and has had  infrequent loose stools after starting antibiotics. He was advanced to clear liquid diet yesterday 7/6 and tolerated this very well without any pain, discomfort or other abnormal feelings/sensations when swallowing.   Objective   Blood pressure 115/67, pulse 70, temperature (!) 97.3 F (36.3 C), temperature source Oral, resp. rate 12, height 6' 3"  (1.905 m), weight 89.2 kg, SpO2 98 %.        Intake/Output Summary (Last 24 hours) at 02/28/2022 1028 Last data filed at 02/28/2022 0900 Gross per 24 hour  Intake 2304.57 ml  Output 1900 ml  Net 404.57 ml    Filed Weights   02/26/22 2224 02/27/22 0829 02/28/22 0500  Weight: 89.8 kg 89.2 kg 89.2 kg    Examination: General: young adult male sitting up in bed in NAD HENT: [place]/AT, PERRL, no JVD. No crepitus on 7/7 exam. Lungs: Clear bilateral breath sounds. No distress Cardiovascular: RRR, no MRG Abdomen: Soft, non-tender, non-distended Extremities: No acute deformity or ROM limitation Neuro: Alert and oriented x3. CN II-XII grossly intact.  Imaging reviewed:   7/5 CXR: Subcutaneous emphysema and pneumomediastinum similar to that seen on prior CT of the abdomen  CT renal: Moderate severity pneumomediastinum with a small adjacent anteromedial right-sided pneumothorax  CT soft tissue neck: Large amount of gas tracking through the deep soft tissues of the neck, the visualized mediastinum and the axillae.  CT chest: Extensive pneumomediastinum and subcutaneous emphysema. No definite evidence of esophageal perforation.  DG UGI with pneumomediastinum and no evidence of esophageal or upper GI leak/perforation  Resolved Hospital Problem list    AKI  Assessment & Plan:  Pneumomediastinum  Secondary to intractable nausea and vomiting presumably in the setting of heat exhaustion. 7/6 Upper GI series negative for esophageal or upper GI perforation. Suspect initial cause was microperforation that was too small for visualization, iso retching.  Tolerated CLD well since 7/6, no nausea, vomiting, or coughing; feeling well. WBC have normalized and pt has remained afebrile and clinically very well appearing.  - Will discontinue Zosyn - Advance to regular diet and continue to monitor - Advised avoidance of strenuous activity, scuba diving, excessive straining over next 6-8 weeks - Will speak with GI to consider dispo today vs tomorrow - Will set pt up for outpatient Pulm follow up next week with repeat CXR  AKI (resolved):  Secondary to dehydration + rhabdo with CK bump to 1253 on admission. Cr was 2.63 on admission, and normalized on 7/7 with good urine output, after volume resuscitation. Other electrolytes wnl.    Best Practice (right click and "Reselect all SmartList Selections" daily)   Diet/type: NPO DVT prophylaxis: LMWH GI prophylaxis: N/A Lines: N/A Foley:  N/A Code Status:  full code Last date of multidisciplinary goals of care discussion [ ]   Labs   CBC: Recent Labs  Lab 02/26/22 2054 02/27/22 0539 02/28/22 0413  WBC 22.9* 19.7* 9.9  NEUTROABS 20.1*  --  5.2  HGB 18.0* 14.3 13.8  HCT 51.9 40.9 40.0  MCV 88.6 89.7 89.5  PLT 487* 370 291     Basic Metabolic Panel: Recent Labs  Lab 02/26/22 2054 02/27/22 0539 02/28/22 0413  NA 139 138 140  K 4.5 4.8 3.9  CL 98 106 102  CO2 15* 21* 26  GLUCOSE 139* 116* 99  BUN 26* 20 8  CREATININE 2.63* 1.35* 0.97  CALCIUM 11.7* 8.9 9.4  MG  --  2.1  --   PHOS  --  5.3*  --     GFR: Estimated Creatinine Clearance: 147.6 mL/min (by C-G formula based on SCr of 0.97 mg/dL). Recent Labs  Lab 02/26/22 2054 02/26/22 2358 02/27/22 0539 02/28/22 0413  WBC 22.9*  --  19.7* 9.9  LATICACIDVEN  --  1.7  --   --      Liver Function Tests: Recent Labs  Lab 02/26/22 2054 02/27/22 0539 02/28/22 0413  AST 46* 48* 57*  ALT 32 24 28  ALKPHOS 142* 94 75  BILITOT 2.7* 1.7* 1.7*  PROT 9.6* 7.0 6.1*  ALBUMIN 6.0* 4.3 3.8    Recent Labs  Lab 02/26/22 2054   LIPASE 24    No results for input(s): "AMMONIA" in the last 168 hours.  ABG No results found for: "PHART", "PCO2ART", "PO2ART", "HCO3", "TCO2", "ACIDBASEDEF", "O2SAT"   Coagulation Profile: No results for input(s): "INR", "PROTIME" in the last 168 hours.  Cardiac Enzymes: Recent Labs  Lab 02/26/22 2054 02/27/22 0539  CKTOTAL 685* 1,253*     HbA1C: No results found for: "HGBA1C"  CBG: Recent Labs  Lab 02/27/22 1545 02/27/22 1942 02/27/22 2352 02/28/22 0341 02/28/22 0741  GLUCAP 84 100* 95 86 93    Review of Systems:   Feeling much better.  Bolds are positive  Constitutional: weight loss, gain, night sweats, Fevers, chills, fatigue .  HEENT: headaches, Sore throat, sneezing, nasal congestion, post nasal drip, Difficulty swallowing, Tooth/dental problems, visual complaints visual changes, ear ache CV:  chest pain, radiates:,Orthopnea, PND, swelling in lower extremities, dizziness, palpitations, syncope.  GI  heartburn, indigestion, abdominal pain, nausea, vomiting, diarrhea, change in bowel habits, loss of appetite, bloody stools.  Resp: cough,  productive: , hemoptysis, dyspnea, chest pain, pleuritic.  Skin: rash or itching or icterus GU: dysuria, change in color of urine, urgency or frequency. flank pain, hematuria  MS: joint pain or swelling. decreased range of motion  Psych: change in mood or affect. depression or anxiety.  Neuro: difficulty with speech, weakness, numbness, ataxia     Past Medical History:  He,  has a past medical history of Allergy, Celiac disease, GERD (gastroesophageal reflux disease), GI symptoms, and Pneumonia.   Surgical History:   Past Surgical History:  Procedure Laterality Date   ENTEROSCOPY N/A 03/25/2016   Procedure: ENTEROSCOPY;  Surgeon: Ronald Lobo, MD;  Location[facility] ENDOSCOPY;  Service: Endoscopy;  Laterality: N/A;   ESOPHAGOGASTRODUODENOSCOPY N/A 08/13/2016   Procedure: ESOPHAGOGASTRODUODENOSCOPY (EGD);  Surgeon: Ronald Lobo, MD;  Location: [facility] ENDOSCOPY;  Service: Endoscopy;  Laterality: N/A;   ESOPHAGOGASTRODUODENOSCOPY ENDOSCOPY     SKIN GRAFT     right thigh to right finger     Social History:   reports that he has never smoked. He has never used smokeless tobacco. He reports that he does not drink alcohol and does not use drugs.   Family History:  His family history includes Bipolar disorder in his maternal grandfather; Cancer in his maternal grandmother; Coronary artery disease in his paternal grandfather; Depression in his father, maternal grandfather, and paternal aunt.   Allergies Allergies  Allergen Reactions   Gluten Meal Other (See Comments)    Celiac disease     Home Medications  Prior to Admission medications   Medication Sig Start Date End Date Taking? Authorizing Provider  hydrOXYzine (VISTARIL) 25 MG capsule Take 25 mg by mouth every 8 (eight) hours as needed for anxiety (may take an additional 55m at bedtime as needed.). may take an additional 526mat bedtime as needed. Patient not taking: Reported on 04/30/2021    [provider]  lisdexamfetamine (VYVANSE) 40 MG capsule 1 cap every morning with breakfast 08/16/18   NaErlinda HongNP  lisdexamfetamine (VYVANSE) 50 MG capsule Take 50 mg by mouth daily.    [provider]  propranolol (INDERAL) 10 MG tablet Take 10 mg by mouth daily as needed.    [provider]  sertraline (ZOLOFT) 25 MG tablet Take 50 mg by mouth daily.    [provider]     Signed,  ScBaldwin [id number]

## 2022-03-01 ENCOUNTER — Encounter: Payer: Self-pay | Admitting: Family Medicine

## 2022-03-04 LAB — CULTURE, BLOOD (ROUTINE X 2)
Culture: NO GROWTH
Culture: NO GROWTH
Special Requests: NORMAL
Special Requests: NORMAL

## 2022-03-05 ENCOUNTER — Encounter: Payer: Self-pay | Admitting: Nurse Practitioner

## 2022-03-05 ENCOUNTER — Ambulatory Visit (INDEPENDENT_AMBULATORY_CARE_PROVIDER_SITE_OTHER): Payer: BC Managed Care – PPO | Admitting: Nurse Practitioner

## 2022-03-05 ENCOUNTER — Ambulatory Visit (INDEPENDENT_AMBULATORY_CARE_PROVIDER_SITE_OTHER): Payer: BC Managed Care – PPO

## 2022-03-05 VITALS — BP 110/72 | HR 62 | Temp 98.1°F | Ht 75.0 in | Wt 195.0 lb

## 2022-03-05 DIAGNOSIS — Z0389 Encounter for observation for other suspected diseases and conditions ruled out: Secondary | ICD-10-CM | POA: Diagnosis not present

## 2022-03-05 DIAGNOSIS — J982 Interstitial emphysema: Secondary | ICD-10-CM | POA: Diagnosis not present

## 2022-03-05 NOTE — Assessment & Plan Note (Addendum)
Clinically improved. He has felt well since discharge without any concerns or complaints. Repeat CXR today with resolved pneumomediastinum and ptx; no subcutaneous air. Advised to continue with activity precautions as recommended upon discharge for a total of 6 weeks. We will plan to see him in a month and if at that point, he is stable, he can return PRN.   Patient Instructions  Chest x ray stable today.   Please notify us if you develop any respiratory symptoms.   Follow up in one month with Dr. Ander Slade. If symptoms do not improve or worsen, please contact office for sooner follow up or seek emergency care.

## 2022-03-05 NOTE — Patient Instructions (Signed)
Chest x ray stable today.   Please notify us if you develop any respiratory symptoms.   Follow up in one month with Dr. Ander Slade. If symptoms do not improve or worsen, please contact office for sooner follow up or seek emergency care.

## 2022-03-05 NOTE — Progress Notes (Signed)
@Patient  ID: [name] [name], male    DOB: 09/01/2000, 21 y.o.   MRN: [id number]  Chief Complaint  Patient presents with   Hospitalization Follow-up    He is doing well and has no concerns,.     Referring provider: Lind Covert, *  HPI: 21 year old male, never smoker followed for pneumothorax and pneumoperitoneum.  He is new to the pulmonary clinic and last seen during his hospitalization by Dr. Ander Slade.  Past medical history significant for celiac disease, ADHD, anxiety.  He presented to [facility] [place] on 7/5 with complaints of intractable nausea and vomiting for several hours.  He had been at the beach the [place] prior to presentation and spent a lot of time in the heat without adequate hydration and excessive alcohol use.  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  He was unable to keep down fluids or solids.  He also experienced about abdominal cramping as well as muscle cramping in the upper and lower extremities.  He had approximately 20 episodes of vomiting in a 4-hour period refractory to Zofran and went to the ED. laboratory evaluation significant for multiple metabolic derangements.  CXR done in the ED demonstrated subcutaneous emphysema which prompted CT chest.  CT showed extensive pneumomediastinum, small adjacent anterior medial right-sided PTX and subcutaneous emphysema without definite evidence of esophageal perforation.  He was admitted to the ICU and PCCM was consulted for further management, who discussed with CT surgery and recommended GI consult.  GI ordered upper GI series which was completed on 02/27/2022.  No obvious leak, tear or perforation noted on series.  He was transition to a regular diet and able to tolerate well.  Crepitus resolved and he was discharged on 02/28/2022.   TEST/EVENTS:  02/26/2022 CXR 1 view: There is subcutaneous emphysema noted bilaterally in the  neck with evidence of mild pneumomediastinum.  There is a small pleural line in the apices bilaterally which may represent mild pneumothorax 02/26/2022 CT renal: There is a moderate amount of anterior mediastinal air with a small adjacent anterior medial right-sided pneumothorax.  Free air is also seen surrounding the visualized portion of the distal esophagus and along the region posterior to the descending thoracic aorta.  There is a small amount of air in the posterior portions of the abdominal aorta as well.  No other acute findings. 02/27/2022 CT soft tissue neck: Large amount of gas tracking through the deep soft tissues of the neck, the visualized mediastinum and the axillae 02/27/2022 CT chest without contrast: Extensive pneumomediastinum.  Oral contrast is present in the esophagus.  No definite contrast extravasation is seen.  There is mild atelectasis in the lingular segment of the left upper lobe.  No consolidation effusion or pneumothorax identified.  There is extensive subcutaneous emphysema.  Air is identified in the epidural space and thoracic spinal canal.  03/05/2022: Today - follow up Patient presents today for follow up after being hospitalized for intractable vomiting and subsequent pneumoperitoneum with subcutaneous air. He was thought to have an esophageal perforation but upon further investigation with upper GI, no leak, tear or perforation was noted. He recovered well and was discharged on 7/7. Today, he reports feeling well since discharged. He has no respiratory concerns or complaints. He has been eating and drinking well without any difficulties. No further besides of nausea or vomiting. He has had normal BMs since discharge. Overall,  feels like he is back to his baseline without any concerns or complaints.   Allergies  Allergen Reactions   Gluten Meal Other (See Comments)    Celiac disease    Immunization History  Administered Date(s) Administered   Influenza,inj,Quad PF,6+ Mos  06/23/2014, 05/30/2015, 05/31/2016, 06/09/2018, 05/30/2021   Influenza-Unspecified 05/21/2017   PFIZER(Purple Top)SARS-COV-2 Vaccination 12/03/2019, 12/26/2019, 08/15/2020    Past Medical History:  Diagnosis Date   Allergy    Celiac disease    GERD (gastroesophageal reflux disease)    GI symptoms    Pneumonia     Tobacco History: Social History   Tobacco Use  Smoking Status Never  Smokeless Tobacco Never   Counseling given: Not Answered   Outpatient Medications Prior to Visit  Medication Sig Dispense Refill   ondansetron (ZOFRAN) 4 MG tablet Take 1 tablet (4 mg total) by mouth every 8 (eight) hours as needed for nausea or vomiting. 20 tablet 0   sertraline (ZOLOFT) 50 MG tablet Take 50 mg by mouth every morning.     No facility-administered medications prior to visit.     Review of Systems:   Constitutional: No weight loss or gain, night sweats, fevers, chills, fatigue, or lassitude. HEENT: No headaches, difficulty swallowing, tooth/dental problems, or sore throat. No sneezing, itching, ear ache, nasal congestion, or post nasal drip CV:  No chest pain, orthopnea, PND, swelling in lower extremities, anasarca, dizziness, palpitations, syncope Resp: No shortness of breath with exertion or at rest. No excess mucus or change in color of mucus. No productive or non-productive. No hemoptysis. No wheezing.  No chest wall deformity GI:  No heartburn, indigestion, abdominal pain, nausea, vomiting, diarrhea, change in bowel habits, loss of appetite, bloody stools.  GU: No dysuria, change in color of urine, urgency or frequency.  No flank pain, no hematuria  Skin: No rash, lesions, ulcerations MSK:  No joint pain or swelling.  No decreased range of motion.  No back pain. Neuro: No dizziness or lightheadedness.  Psych: No depression or anxiety. Mood stable.     Physical Exam:  BP 110/72 (Cuff Size: Normal)   Pulse 62   Temp 98.1 F (36.7 C) (Oral)   Ht 6' 3"  (1.905 m)   Wt  195 lb (88.5 kg)   SpO2 98%   BMI 24.37 kg/m   GEN: Pleasant, interactive, well-appearing; in no acute distress. HEENT:  Normocephalic and atraumatic. PERRLA. Sclera white. Nasal turbinates pink, moist and patent bilaterally. No rhinorrhea present. Oropharynx pink and moist, without exudate or edema. No lesions, ulcerations, or postnasal drip.  NECK:  Supple w/ fair ROM. No JVD present. Normal carotid impulses w/o bruits. Thyroid symmetrical with no goiter or nodules palpated. No lymphadenopathy.  No subcutaneous air CV: RRR, no m/r/g, no peripheral edema. Pulses intact, +2 bilaterally. No cyanosis, pallor or clubbing. PULMONARY:  Unlabored, regular breathing. Clear bilaterally A&P w/o wheezes/rales/rhonchi. No accessory muscle use. No dullness to percussion. GI: BS present and normoactive. Soft, non-tender to palpation. No organomegaly or masses detected. No CVA tenderness. MSK: No erythema, warmth or tenderness. Cap refil <2 sec all extrem. No deformities or joint swelling noted.  Neuro: A/Ox3. No focal deficits noted.   Skin: Warm, no lesions or rashe Psych: Normal affect and behavior. Judgement and thought content appropriate.     Lab Results:  CBC    Component Value Date/Time   WBC 9.9 02/28/2022 0413   RBC 4.47 02/28/2022 0413   HGB 13.8 02/28/2022 0413   HCT 40.0 02/28/2022 0413  PLT 291 02/28/2022 0413   MCV 89.5 02/28/2022 0413   MCH 30.9 02/28/2022 0413   MCHC 34.5 02/28/2022 0413   RDW 12.4 02/28/2022 0413   LYMPHSABS 3.7 02/28/2022 0413   MONOABS 0.8 02/28/2022 0413   EOSABS 0.1 02/28/2022 0413   BASOSABS 0.0 02/28/2022 0413    BMET    Component Value Date/Time   NA 140 02/28/2022 0413   K 3.9 02/28/2022 0413   CL 102 02/28/2022 0413   CO2 26 02/28/2022 0413   GLUCOSE 99 02/28/2022 0413   BUN 8 02/28/2022 0413   CREATININE 0.97 02/28/2022 0413   CALCIUM 9.4 02/28/2022 0413   GFRNONAA >60 02/28/2022 0413   GFRAA NOT CALCULATED 11/29/2015 1749     BNP No results found for: "BNP"   Imaging:  DG Chest 2 View  Result Date: 03/05/2022 CLINICAL DATA:  Pneumomediastinum and right pneumothorax. EXAM: CHEST - 2 VIEW COMPARISON:  CT scan of the chest February 27, 2022 FINDINGS: The previously identified right pneumothorax and pneumomediastinum have resolved. The heart, hila, mediastinum, lungs, and pleura are otherwise unremarkable. IMPRESSION: No pneumothorax or pneumomediastinum.  No other abnormalities. Electronically Signed   By: Dorise Bullion III M.D.   On: 03/05/2022 15:28   DG UGI W SINGLE CM (SOL OR THIN BA)  Result Date: 02/27/2022 CLINICAL DATA:  Intractable vomiting. Pneumomediastinum. Evaluate for esophageal perforation. EXAM: WATER SOLUBLE UPPER GI SERIES TECHNIQUE: Single-column upper GI series was performed using water soluble Omnipaque 300 contrast. CONTRAST:  272m OMNIPAQUE IOHEXOL 300 MG/ML  SOLN COMPARISON:  None Available. FLUOROSCOPY: Fluoroscopy Time:  2 minutes 54 seconds FINDINGS: The scout image shows a small amount of oral contrast material within the colon. No evidence of dilated bowel loops. Pneumomediastinum is noted as well as subcutaneous emphysema in neck. The esophagus is normal in appearance. There is no evidence of leak or extravasation of contrast material from the esophagus. No evidence of esophageal stricture or hiatal hernia. Moderate gastroesophageal reflux was seen to the level of the proximal thoracic esophagus. The stomach and duodenal sweep are normal in appearance, without evidence of contrast leak or extravasation. Prompt gastric emptying is seen. Normal position of the DJJ in the left upper quadrant. IMPRESSION: Pneumomediastinum. No evidence of esophageal or upper GI leak/perforation. Moderate gastroesophageal reflux incidentally noted. Electronically Signed   By: JMarlaine HindM.D.   On: 02/27/2022 13:00   CT Chest Wo Contrast  Result Date: 02/27/2022 CLINICAL DATA:  Esophageal perforation. EXAM: CT  CHEST WITHOUT CONTRAST TECHNIQUE: Multidetector CT imaging of the chest was performed following the standard protocol without IV contrast. RADIATION DOSE REDUCTION: This exam was performed according to the departmental dose-optimization program which includes automated exposure control, adjustment of the mA and/or kV according to patient size and/or use of iterative reconstruction technique. COMPARISON:  02/26/2022. FINDINGS: Cardiovascular: The heart is normal in size and there is no pericardial effusion. The aorta and pulmonary trunk are normal in caliber. Mediastinum/Nodes: Extensive pneumomediastinum is noted. Oral contrast is present in the esophagus. No definite contrast extravasation is seen. The thyroid gland and trachea are within normal limits. No mediastinal, hilar, or axillary lymphadenopathy. Lungs/Pleura: Mild atelectasis in the lingular segment of the left upper lobe. No consolidation, effusion, or pneumothorax. Upper Abdomen: No acute abnormality. Contrast is present in the stomach. Musculoskeletal: Extensive subcutaneous emphysema. Air is identified in the epidural space in the thoracic spinal canal. No acute osseous abnormality. IMPRESSION: 1. Extensive pneumomediastinum and subcutaneous emphysema. 2. No definite evidence of esophageal perforation.  Conventional upper GI is recommended for follow-up evaluation. Electronically Signed   By: Brett Fairy M.D.   On: 02/27/2022 00:59   CT Soft Tissue Neck Wo Contrast  Result Date: 02/27/2022 CLINICAL DATA:  Vomiting EXAM: CT NECK WITHOUT CONTRAST TECHNIQUE: Multidetector CT imaging of the neck was performed following the standard protocol without intravenous contrast. RADIATION DOSE REDUCTION: This exam was performed according to the departmental dose-optimization program which includes automated exposure control, adjustment of the mA and/or kV according to patient size and/or use of iterative reconstruction technique. COMPARISON:  None Available.  FINDINGS: Pharynx and larynx: Limited assessment for subtle abnormalities because of motion. Salivary glands: Unremarkable Thyroid: Normal Lymph nodes: None enlarged or abnormal density. Vascular: Unremarkable unenhanced appearance Limited intracranial: Normal Visualized orbits: Normal Mastoids and visualized paranasal sinuses: Clear. Skeleton: No acute or aggressive process. Other: There is a large amount of gas tracking through the deep soft tissues of the neck, the visualized mediastinum and the axillae. There is small volume pneumorrhachis. IMPRESSION: 1. Large amount of gas tracking through the deep soft tissues of the neck, the visualized mediastinum and the axillae. The source is not identified. Electronically Signed   By: Ulyses Jarred M.D.   On: 02/27/2022 00:54   DG Chest Port 1 View  Result Date: 02/27/2022 CLINICAL DATA:  Chest pain EXAM: PORTABLE CHEST 1 VIEW COMPARISON:  09/06/2015, CT of the abdomen from earlier in the same day. FINDINGS: Cardiac shadow is within normal limits. Subcutaneous emphysema is noted bilaterally in the neck with evidence of mild pneumomediastinum. A small pleural line is suggestion in the apices bilaterally which may represent mild pneumothorax. This will be better evaluated on upcoming CT of the chest. No acute bony abnormality is noted. IMPRESSION: Subcutaneous emphysema and pneumomediastinum similar to that seen on prior CT of the abdomen. This will be better evaluated on upcoming chest CT. Electronically Signed   By: Inez Catalina M.D.   On: 02/27/2022 00:28   CT Renal Stone Study  Result Date: 02/27/2022 CLINICAL DATA:  Flank pain. EXAM: CT ABDOMEN AND PELVIS WITHOUT CONTRAST TECHNIQUE: Multidetector CT imaging of the abdomen and pelvis was performed following the standard protocol without IV contrast. RADIATION DOSE REDUCTION: This exam was performed according to the departmental dose-optimization program which includes automated exposure control, adjustment of the  mA and/or kV according to patient size and/or use of iterative reconstruction technique. COMPARISON:  None Available. FINDINGS: Lower chest: A moderate amount of anterior mediastinal air is seen with a small adjacent anteromedial right-sided pneumothorax (maximum AP measurement of the mediastinal air measures 2.1 cm, while the maximum AP measurement of the right-sided pneumothorax measures 1.5 cm). Free air is also seen surrounding the visualized portion of the distal esophagus (approximately 1.1 cm in thickness) and along the region posterior to the descending thoracic aorta (approximately 4 mm in AP measurement). A small amount of air is also seen posterior to portions of the abdominal aorta. Hepatobiliary: No focal liver abnormality is seen. No gallstones, gallbladder wall thickening, or biliary dilatation. Pancreas: Unremarkable. No pancreatic ductal dilatation or surrounding inflammatory changes. Spleen: Normal in size without focal abnormality. Adrenals/Urinary Tract: Adrenal glands are unremarkable. Kidneys are normal, without renal calculi, focal lesion, or hydronephrosis. Bladder is unremarkable. Stomach/Bowel: Stomach is within normal limits. Appendix appears normal. No evidence of bowel wall thickening, distention, or inflammatory changes. Vascular/Lymphatic: No significant vascular findings are present. No enlarged abdominal or pelvic lymph nodes. Reproductive: Uterus and bilateral adnexa are unremarkable. Other: No abdominal wall  hernia or abnormality. No abdominopelvic ascites. Musculoskeletal: No acute or significant osseous findings. IMPRESSION: Moderate severity pneumomediastinum with a small adjacent anteromedial right-sided pneumothorax. Further evaluation with neck and chest CT is recommended. Electronically Signed   By: Virgina Norfolk M.D.   On: 02/27/2022 00:07          No data to display          No results found for: "NITRICOXIDE"      Assessment & Plan:    Pneumomediastinum ([place]) Clinically improved. He has felt well since discharge without any concerns or complaints. Repeat CXR today with resolved pneumomediastinum and ptx; no subcutaneous air. Advised to continue with activity precautions as recommended upon discharge for a total of 6 weeks. We will plan to see him in a month and if at that point, he is stable, he can return PRN.   Patient Instructions  Chest x ray stable today.   Please notify us if you develop any respiratory symptoms.   Follow up in one month with Dr. Ander Slade. If symptoms do not improve or worsen, please contact office for sooner follow up or seek emergency care.    I spent 28 minutes of dedicated to the care of this patient on the date of this encounter to include pre-visit review of records, face-to-face time with the patient discussing conditions above, post visit ordering of testing, clinical documentation with the electronic health record, making appropriate referrals as documented, and communicating necessary findings to members of the patients care team.  Clayton Bibles, NP 03/05/2022  Pt aware and understands NP's role.

## 2022-03-06 NOTE — Progress Notes (Signed)
Please notify patient that the final read came back on his CXR and it shows that pneumomediastinum and ptx have resolved. Lungs were clear. Thanks.

## 2022-03-31 ENCOUNTER — Ambulatory Visit: Payer: BC Managed Care – PPO | Admitting: Nurse Practitioner

## 2023-01-02 DIAGNOSIS — F4322 Adjustment disorder with anxiety: Secondary | ICD-10-CM | POA: Diagnosis not present

## 2023-01-06 DIAGNOSIS — F4322 Adjustment disorder with anxiety: Secondary | ICD-10-CM | POA: Diagnosis not present

## 2023-01-07 DIAGNOSIS — F4322 Adjustment disorder with anxiety: Secondary | ICD-10-CM | POA: Diagnosis not present

## 2023-01-08 DIAGNOSIS — F900 Attention-deficit hyperactivity disorder, predominantly inattentive type: Secondary | ICD-10-CM | POA: Diagnosis not present

## 2023-01-08 DIAGNOSIS — Z5181 Encounter for therapeutic drug level monitoring: Secondary | ICD-10-CM | POA: Diagnosis not present

## 2023-01-08 DIAGNOSIS — F411 Generalized anxiety disorder: Secondary | ICD-10-CM | POA: Diagnosis not present

## 2023-01-12 DIAGNOSIS — F4322 Adjustment disorder with anxiety: Secondary | ICD-10-CM | POA: Diagnosis not present

## 2023-01-14 DIAGNOSIS — F4322 Adjustment disorder with anxiety: Secondary | ICD-10-CM | POA: Diagnosis not present

## 2023-01-20 ENCOUNTER — Ambulatory Visit: Payer: BC Managed Care – PPO | Admitting: Family Medicine

## 2023-01-23 DIAGNOSIS — F4322 Adjustment disorder with anxiety: Secondary | ICD-10-CM | POA: Diagnosis not present

## 2023-01-29 DIAGNOSIS — F4322 Adjustment disorder with anxiety: Secondary | ICD-10-CM | POA: Diagnosis not present

## 2023-03-03 DIAGNOSIS — F4322 Adjustment disorder with anxiety: Secondary | ICD-10-CM | POA: Diagnosis not present

## 2023-03-27 DIAGNOSIS — F4322 Adjustment disorder with anxiety: Secondary | ICD-10-CM | POA: Diagnosis not present

## 2023-04-07 DIAGNOSIS — F4322 Adjustment disorder with anxiety: Secondary | ICD-10-CM | POA: Diagnosis not present

## 2023-04-24 DIAGNOSIS — F4322 Adjustment disorder with anxiety: Secondary | ICD-10-CM | POA: Diagnosis not present

## 2023-06-04 DIAGNOSIS — F4322 Adjustment disorder with anxiety: Secondary | ICD-10-CM | POA: Diagnosis not present

## 2023-07-21 ENCOUNTER — Ambulatory Visit: Payer: BC Managed Care – PPO | Admitting: Family Medicine

## 2023-08-11 DIAGNOSIS — K900 Celiac disease: Secondary | ICD-10-CM | POA: Diagnosis not present

## 2023-08-12 ENCOUNTER — Encounter: Payer: Self-pay | Admitting: Family Medicine

## 2023-08-12 ENCOUNTER — Ambulatory Visit: Payer: BC Managed Care – PPO | Admitting: Family Medicine

## 2023-08-12 VITALS — BP 134/60 | HR 97 | Ht 75.0 in | Wt 222.0 lb

## 2023-08-12 DIAGNOSIS — Z7184 Encounter for health counseling related to travel: Secondary | ICD-10-CM

## 2023-08-12 DIAGNOSIS — Z23 Encounter for immunization: Secondary | ICD-10-CM

## 2023-08-12 NOTE — Patient Instructions (Signed)
Good to see you today - Thank you for coming in  We will look for your records in Epic  Let us know of any last minute questions or problems  Have a great time in [place]

## 2023-08-13 NOTE — Progress Notes (Signed)
    SUBJECTIVE:   CHIEF COMPLAINT / HPI:   Travel He will be traveling to [place] next semester.  Wanted to discuss any precautions or needed vaccines.  He will be traveling with friends  Celiac Following regularly with GI and is stable  Mood Good - no current counseling  Immunizations Previously seen by Dr Donnie Coffin  No records in [place] immunization database except Covid vaccine  I cannot find any scanned records in Epic   OBJECTIVE:   BP 134/60   Pulse 97   Ht 6\' 3"  (1.905 m)   Wt 222 lb (100.7 kg)   SpO2 98%   BMI 27.75 kg/m   Heart - Regular rate and rhythm.  No murmurs, gallops or rubs.    Lungs:  Normal respiratory effort, chest expands symmetrically. Lungs are clear to auscultation, no crackles or wheezes. Abdomen: soft and non-tender without masses, organomegaly or hernias noted.  No guarding or rebound Ears - R canal nearly occluded with wax Normal strength and neuro exam   ASSESSMENT/PLAN:   Encounter for immunization Best boy Vaccine 36yrs & older  Travel advice encounter Assessment & Plan: We reviewed CDC recommendations.  By history is current on all recommended vaccines except covid booster which he got today.   Reviewed travel and safety precautions       Patient Instructions  Good to see you today - Thank you for coming in  We will look for your records in Epic  Let us know of any last minute questions or problems  Have a great time in [place]     Carney Living, MD [facility]

## 2023-08-13 NOTE — Assessment & Plan Note (Signed)
We reviewed CDC recommendations.  By history is current on all recommended vaccines except covid booster which he got today.   Reviewed travel and safety precautions

## 2023-08-24 ENCOUNTER — Ambulatory Visit: Payer: BC Managed Care – PPO | Admitting: Family Medicine

## 2023-09-03 ENCOUNTER — Telehealth: Payer: Self-pay

## 2023-09-03 MED ORDER — AZITHROMYCIN 250 MG PO TABS: ORAL_TABLET | ORAL | 0 refills | Status: AC

## 2023-09-03 MED ORDER — CIPROFLOXACIN HCL 500 MG PO TABS: ORAL_TABLET | ORAL | 0 refills | Status: AC

## 2023-09-03 NOTE — Telephone Encounter (Signed)
 Patient and mother did not answer their phones, I was able to LVM informing them rx has been to sent to pharmacy. Penni Bombard CMA

## 2023-09-03 NOTE — Telephone Encounter (Signed)
 Please let them know I sent in the Rxs

## 2023-09-03 NOTE — Telephone Encounter (Signed)
 Received call from patient's mother requesting prescriptions for antibiotics prior to patient leaving for [place].   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   She is asking for a prescription of Cipro  and a Zpak, in the event that patient would need either of these while out of the country.   Will forward to PCP for further advisement.   Chiquita JAYSON English, RN

## 2024-01-11 DIAGNOSIS — H04121 Dry eye syndrome of right lacrimal gland: Secondary | ICD-10-CM | POA: Diagnosis not present

## 2024-01-11 DIAGNOSIS — H5213 Myopia, bilateral: Secondary | ICD-10-CM | POA: Diagnosis not present

## 2024-06-23 DIAGNOSIS — F4322 Adjustment disorder with anxiety: Secondary | ICD-10-CM | POA: Diagnosis not present

## 2024-07-20 DIAGNOSIS — M20012 Mallet finger of left finger(s): Secondary | ICD-10-CM | POA: Diagnosis not present

## 2024-07-20 DIAGNOSIS — M79644 Pain in right finger(s): Secondary | ICD-10-CM | POA: Diagnosis not present

## 2024-07-29 DIAGNOSIS — S63247A Subluxation of distal interphalangeal joint of left little finger, initial encounter: Secondary | ICD-10-CM | POA: Diagnosis not present

## 2024-07-29 DIAGNOSIS — Y999 Unspecified external cause status: Secondary | ICD-10-CM | POA: Diagnosis not present

## 2024-07-29 DIAGNOSIS — S62637A Displaced fracture of distal phalanx of left little finger, initial encounter for closed fracture: Secondary | ICD-10-CM | POA: Diagnosis not present

## 2024-07-29 DIAGNOSIS — X58XXXA Exposure to other specified factors, initial encounter: Secondary | ICD-10-CM | POA: Diagnosis not present
